# Patient Record
Sex: Male | Born: 1970
Health system: Southern US, Community
[De-identification: ages and names within clinical notes are randomized; demographics above are authoritative.]

## PROBLEM LIST (undated history)

## (undated) DIAGNOSIS — K219 Gastro-esophageal reflux disease without esophagitis: Secondary | ICD-10-CM

## (undated) DIAGNOSIS — K649 Unspecified hemorrhoids: Secondary | ICD-10-CM

## (undated) DIAGNOSIS — G44009 Cluster headache syndrome, unspecified, not intractable: Secondary | ICD-10-CM

## (undated) HISTORY — DX: Unspecified hemorrhoids: K64.9

## (undated) HISTORY — DX: Cluster headache syndrome, unspecified, not intractable: G44.009

## (undated) HISTORY — PX: HEMORRHOID SURGERY: SHX153

## (undated) HISTORY — DX: Gastro-esophageal reflux disease without esophagitis: K21.9

## (undated) HISTORY — PX: CYSTOSCOPY: SUR368

## (undated) HISTORY — PX: HEMORRHOID BANDING: SHX5850

---

## 2006-07-28 ENCOUNTER — Ambulatory Visit (HOSPITAL_COMMUNITY): Admission: RE | Admit: 2006-07-28 | Discharge: 2006-07-28 | Payer: Self-pay | Admitting: Family Medicine

## 2006-07-29 ENCOUNTER — Ambulatory Visit (HOSPITAL_COMMUNITY): Admission: RE | Admit: 2006-07-29 | Discharge: 2006-07-29 | Payer: Self-pay | Admitting: Family Medicine

## 2006-08-07 ENCOUNTER — Encounter (HOSPITAL_COMMUNITY): Admission: RE | Admit: 2006-08-07 | Discharge: 2006-09-06 | Payer: Self-pay | Admitting: Family Medicine

## 2007-02-03 ENCOUNTER — Ambulatory Visit (HOSPITAL_COMMUNITY): Admission: RE | Admit: 2007-02-03 | Discharge: 2007-02-03 | Payer: Self-pay | Admitting: Family Medicine

## 2008-10-18 ENCOUNTER — Ambulatory Visit (HOSPITAL_COMMUNITY): Admission: RE | Admit: 2008-10-18 | Discharge: 2008-10-18 | Payer: Self-pay | Admitting: Family Medicine

## 2010-08-20 ENCOUNTER — Ambulatory Visit: Payer: Self-pay | Admitting: Internal Medicine

## 2010-08-20 ENCOUNTER — Ambulatory Visit: Payer: Self-pay | Admitting: Gastroenterology

## 2010-08-20 DIAGNOSIS — K921 Melena: Secondary | ICD-10-CM | POA: Insufficient documentation

## 2010-08-20 DIAGNOSIS — K644 Residual hemorrhoidal skin tags: Secondary | ICD-10-CM | POA: Insufficient documentation

## 2010-08-20 DIAGNOSIS — R109 Unspecified abdominal pain: Secondary | ICD-10-CM | POA: Insufficient documentation

## 2010-08-21 ENCOUNTER — Encounter: Payer: Self-pay | Admitting: Internal Medicine

## 2010-08-31 ENCOUNTER — Ambulatory Visit (HOSPITAL_COMMUNITY): Admission: RE | Admit: 2010-08-31 | Discharge: 2010-08-31 | Payer: Self-pay | Admitting: Internal Medicine

## 2010-08-31 ENCOUNTER — Ambulatory Visit: Payer: Self-pay | Admitting: Internal Medicine

## 2010-08-31 HISTORY — PX: COLONOSCOPY: SHX174

## 2010-10-23 ENCOUNTER — Encounter: Payer: Self-pay | Admitting: Internal Medicine

## 2010-11-27 NOTE — Letter (Signed)
Summary: REFERRAL FROM DR Phillips Odor  REFERRAL FROM DR Phillips Odor   Imported By: Rexene Alberts 08/21/2010 11:32:04  _____________________________________________________________________  External Attachment:    Type:   Image     Comment:   External Document

## 2010-11-27 NOTE — Letter (Signed)
Summary: TCS ORDER  TCS ORDER   Imported By: Ave Filter 08/20/2010 15:48:20  _____________________________________________________________________  External Attachment:    Type:   Image     Comment:   External Document

## 2010-11-27 NOTE — Assessment & Plan Note (Signed)
Summary: RECTAL BLEEDING,CONSULT FOR TCS/CM   Visit Type:  Initial Consult Referring Provider:  Dwyane Luo Primary Care Provider:  Phillips Odor  CC:  rectal bleeding.  History of Present Illness: Mr. Samuel Hardy is a pleasant 40 year old Caucasian male who presents today due to an acute onset of rectal bleeding that began on 07/15/10. He was actually at a family member's funeral and felt a "gush". He proceeded to the restroom where he noted a large amount of bright red blood per rectum. No clots. Noted 2 other separate occasions of moderate brbpr after BM, in stool and wiping. Has hx of known hemorrhoids as documented by PCP as well as hx of emergent repair while on vacation in [place]. Does have intermittent itching and burning associated with hemorrhoids. Reports occasional lower abdominal pain relieved by diarrhea, increased flatus X 1 year. Normally has 1-3 BMs daily. Also complains of LUQ discomfort, sharp, intermittent, not associated with eating or drinking for the past year as well. Occasionally takes Tums but not weekly. Does experience nausea with cluster headaches, otherwise no nausea noted. No appetite loss, No weight changes. Last colonoscopy 10 years ago at [facility] secondary to rectal bleeding. Reportedly normal.    Preventive Screening-Counseling & Management  Alcohol-Tobacco     Smoking Status: quit  Caffeine-Diet-Exercise     Does Patient Exercise: no      Drug Use:  no.    Current Medications (verified): 1)  Imitrex .... As Needed 2)  Prednisone Dose Pak .... As Directed 3)  Verapamil .... As Needed 4)  Oxygen .... As Needed 5)  Alprazolam 0.25 Mg Tabs (Alprazolam) .... 1/2 To One Tablet Occasionally  Allergies (verified): 1)  ! Jonne Ply  Past History:  Past Medical History: Cluster headaches hemorrhoids GERD TCS: 10 years ago in [place], reportedly normal  Past Surgical History: Hemorrhoidectomy [facility]) cystoscopy when 13  Family History: No FH of Colon  Cancer: Mother:living, Diabetes Father: deceased, Alzheimer's disease  Social History: Occupation: Technical brewer Patient is a former smoker. quit 6 years ago, 1-2 ppd X 14 days Alcohol Use - yes, socially Daily Caffeine Use: Coke, several cans per day Illicit Drug Use - no Patient does not get regular exercise.  Smoking Status:  quit Drug Use:  no Does Patient Exercise:  no  Review of Systems General:  Denies fever, chills, and anorexia. Eyes:  Denies blurring, irritation, and discharge. ENT:  Denies sore throat, hoarseness, and difficulty swallowing. CV:  Denies chest pains, syncope, and dyspnea on exertion. Resp:  Denies dyspnea at rest, cough, and wheezing. GI:  Complains of indigestion/heartburn, abdominal pain, and diarrhea; denies difficulty swallowing, pain on swallowing, nausea, and bloody BM's; occasional indigestion. GU:  Denies urinary burning, blood in urine, and urinary frequency. MS:  Denies joint pain / LOM, joint swelling, and joint stiffness. Derm:  Denies rash, itching, and dry skin. Neuro:  Denies weakness, paralysis, and abnormal sensation. Psych:  Denies depression and anxiety.   Vital Signs:  Patient profile:   40 year old male Height:      73 inches Weight:      175 pounds BMI:     23.17 Temp:     97.8 degrees F oral Pulse rate:   80 / minute BP sitting:   120 / 78  (right arm) Cuff size:   regular  Vitals Entered By: Cloria Spring LPN (August 20, 2010 2:27 PM)  Physical Exam  General:  Well developed, well nourished, no acute distress. Head:  Normocephalic and  atraumatic. Eyes:  non-icteric Mouth:  No deformity or lesions, dentition normal. Neck:  Supple; no masses or thyromegaly. Chest Wall:  Symmetrical,  no deformities . Lungs:  Clear throughout to auscultation. Heart:  Regular rate and rhythm; no murmurs, rubs,  or bruits. Abdomen:  normal bowel sounds, without guarding, without rebound, no tenderness, no masses, and no  hepatomegally or splenomegaly.   Rectal:  deferred until time of colonoscopy.   Msk:  Symmetrical with no gross deformities. Normal posture. Pulses:  Normal pulses noted. Extremities:  No clubbing, cyanosis, edema or deformities noted. Neurologic:  Alert and  oriented x4;  grossly normal neurologically.  Impression & Recommendations:  Problem # 1:  HEMATOCHEZIA (ICD-578.1) Assessment New  40 year old pleasant Caucasian male with new onset of rectal bleeding in September 2011. First occurence unrelated to BM and large amount; subsequent 2 episodes associated with BM. Known hx of hemorrhoids as well as repair in past; however, associated lower abdominal pain and last colonoscopy 10 years ago.    Will need TCS with Dr. Jena Gauss in the near future. The risks and benefits have been discussed with the patient and his wife; they both state understanding and desire to proceed.   Orders: Consultation Level III (16109)  Problem # 2:  ABDOMINAL PAIN-MULTIPLE SITES (ICD-789.09) Assessment: New  Lower abdominal pain with resolution after diarrhea. Diarrhea is rare and intermittent. Normally has 1-3 soft BMs daily. LUQ pain, sharp and intermittent in nature, not associated with eating/drinking X 1 year. Rarely takes Tums.   Prilosec 20 mg daily, 2 weeks worth of samples given to pt. Rx called to pt's pharmacy See #1   Orders: Consultation Level III (60454)  Problem # 3:  HEMORRHOIDS-EXTERNAL (ICD-455.3)  Known hx of hemorrhoids requiring repair while on vacation in [facility]; recent rectal exam by PCP. Not taking anything currently for relief.   Anusol 25 mg supp per rectum twice daily as needed, #20 sent to pt's pharmacy TCS in near future with Dr. Jena Gauss  Orders: Consultation Level III [id number])  Patient Instructions: 1)  TCS will be set up with Dr. Jena Gauss in near future 2)  Prilosec 20 mg by mouth daily, call if no improvement 3)  Anusol suppositories Prescriptions: OMEPRAZOLE 20 MG  CPDR (OMEPRAZOLE) 1 by mouth daily  #30 x 3   Entered and Authorized by:   Gerrit Halls NP   Signed by:   Gerrit Halls NP on 08/20/2010   Method used:   Faxed to ...       [facility]* (retail)       [address]       [place], [place]  [address]       Ph: [phone]       Fax: [phone]   RxID:   [id number] ANUSOL-HC 25 MG SUPP (HYDROCORTISONE ACETATE) 1 per rectum two times a day  #20 x 0   Entered by:   Gerrit Halls NP   Authorized by:   Joselyn Arrow FNP-BC   Signed by:   Gerrit Halls NP on 08/20/2010   Method used:   Print then Give to Patient   RxID:   [id number]  We would like to thank Dwyane Luo, PA-C and Dr. Assunta Found for the referral of this nice gentleman.

## 2014-07-19 ENCOUNTER — Encounter (INDEPENDENT_AMBULATORY_CARE_PROVIDER_SITE_OTHER): Payer: Self-pay

## 2014-07-19 ENCOUNTER — Encounter: Payer: Self-pay | Admitting: Internal Medicine

## 2014-07-19 ENCOUNTER — Ambulatory Visit (INDEPENDENT_AMBULATORY_CARE_PROVIDER_SITE_OTHER): Payer: 59 | Admitting: Internal Medicine

## 2014-07-19 VITALS — BP 128/83 | HR 79 | Temp 97.4°F | Ht 69.0 in | Wt 175.0 lb

## 2014-07-19 DIAGNOSIS — K648 Other hemorrhoids: Secondary | ICD-10-CM

## 2014-07-19 NOTE — Patient Instructions (Signed)
Avoid straining.  Benefiber 2 teaspoons twice daily  Limit toilet time to 2-3 minutes  Use nitroglycerin ointment as prescribed  Call with any interim problems  Schedule followup appointment in 2-3 weeks from now

## 2014-07-19 NOTE — Progress Notes (Signed)
[facility] banding procedure note:   Patient presents with a 20 year history of painful hemorrhoids intermittent protrusion bleeding these days. Has itching and burning after most bowel movements denies straining. Hemorrhoid creams and suppositories have not helped. He desires banding.  Intermittently has to reduce the hemorrhoid. The patient presents with symptomatic grade 2-3 hemorrhoids, unresponsive to maximal medical therapy, requesting rubber band ligation of his/her hemorrhoidal disease. All risks, benefits, and alternative forms of therapy were described and informed consent was obtained.  In the left lateral decubitus position, digital rectal exam performed revealed one small protruding hemorrhoid which was easily reducible. No other abnormalities observed..  The decision was made to band the right anterior internal hemorrhoid; the [facility] System was used to perform band ligation without complication. Digital anorectal examination was then performed to assure proper positioning of the band;  patient had discomfort upon digital rectal exam before and after band placement suggestive of a small occult fissure. The band, itself, was felt to be in optimal position upon digital examination. I elected to go ahead and place a pea-sized amount of nitroglycerin 0.125% in the anus following the procedure.The patient was discharged home without pain or other issues. Dietary and behavioral recommendations were given;  prescription for nitroglycerin ointment as well.  Followup appointment in 2-3 weeks.  No complications were encountered and the patient tolerated the procedure well.

## 2014-08-16 ENCOUNTER — Encounter: Payer: 59 | Admitting: Internal Medicine

## 2014-08-16 ENCOUNTER — Encounter: Payer: Self-pay | Admitting: Internal Medicine

## 2016-06-26 ENCOUNTER — Ambulatory Visit (HOSPITAL_COMMUNITY)
Admission: RE | Admit: 2016-06-26 | Discharge: 2016-06-26 | Disposition: A | Discharge status: Home / Self Care | Payer: Managed Care, Other (non HMO) | Source: Ambulatory Visit | Attending: Family Medicine | Admitting: Family Medicine

## 2016-06-26 ENCOUNTER — Other Ambulatory Visit (HOSPITAL_COMMUNITY): Payer: Self-pay | Admitting: Family Medicine

## 2016-06-26 DIAGNOSIS — N50812 Left testicular pain: Secondary | ICD-10-CM | POA: Insufficient documentation

## 2016-06-26 DIAGNOSIS — I861 Scrotal varices: Secondary | ICD-10-CM | POA: Diagnosis not present

## 2017-05-30 DIAGNOSIS — M5410 Radiculopathy, site unspecified: Secondary | ICD-10-CM | POA: Diagnosis not present

## 2017-05-30 DIAGNOSIS — Z1389 Encounter for screening for other disorder: Secondary | ICD-10-CM | POA: Diagnosis not present

## 2017-05-30 DIAGNOSIS — Z6825 Body mass index (BMI) 25.0-25.9, adult: Secondary | ICD-10-CM | POA: Diagnosis not present

## 2017-05-30 DIAGNOSIS — M545 Low back pain: Secondary | ICD-10-CM | POA: Diagnosis not present

## 2017-05-30 DIAGNOSIS — M5136 Other intervertebral disc degeneration, lumbar region: Secondary | ICD-10-CM | POA: Diagnosis not present

## 2017-05-30 DIAGNOSIS — E663 Overweight: Secondary | ICD-10-CM | POA: Diagnosis not present

## 2017-05-30 MED FILL — CELECOXIB 200 MG CAP: 200 | 90 days supply | Qty: 90 | Fill #0

## 2017-05-30 MED FILL — HYDROCODON-APAP 5-325: 5-325 | 10 days supply | Qty: 60 | Fill #0

## 2017-05-30 MED FILL — CYCLOBENZAPRINE 10 MG TAB: 10 | 20 days supply | Qty: 60 | Fill #0

## 2017-06-06 ENCOUNTER — Other Ambulatory Visit (HOSPITAL_COMMUNITY): Payer: Self-pay | Admitting: Family Medicine

## 2017-06-06 DIAGNOSIS — M5136 Other intervertebral disc degeneration, lumbar region: Secondary | ICD-10-CM

## 2017-06-06 DIAGNOSIS — M5416 Radiculopathy, lumbar region: Secondary | ICD-10-CM

## 2017-06-19 DIAGNOSIS — F419 Anxiety disorder, unspecified: Secondary | ICD-10-CM | POA: Diagnosis not present

## 2017-06-19 DIAGNOSIS — Z008 Encounter for other general examination: Secondary | ICD-10-CM | POA: Diagnosis not present

## 2017-06-19 DIAGNOSIS — Z6824 Body mass index (BMI) 24.0-24.9, adult: Secondary | ICD-10-CM | POA: Diagnosis not present

## 2017-06-19 DIAGNOSIS — G43109 Migraine with aura, not intractable, without status migrainosus: Secondary | ICD-10-CM | POA: Diagnosis not present

## 2017-06-19 DIAGNOSIS — Z0000 Encounter for general adult medical examination without abnormal findings: Secondary | ICD-10-CM | POA: Diagnosis not present

## 2017-07-01 ENCOUNTER — Ambulatory Visit (HOSPITAL_COMMUNITY): Payer: 59

## 2017-07-02 ENCOUNTER — Telehealth: Payer: Self-pay

## 2017-07-02 NOTE — Telephone Encounter (Signed)
Pt's wife, Samuel Hardy, called to say that he had received a letter from DS to schedule his colonoscopy. She has questions about if this will be considered a screening since this will not be his first colonoscopy. I told her that DS would have to call him back and she does triages in the afternoon and she can answer all their questions then or she could call the insurance company. Please call [phone]

## 2017-07-03 ENCOUNTER — Ambulatory Visit (HOSPITAL_COMMUNITY)
Admission: RE | Admit: 2017-07-03 | Discharge: 2017-07-03 | Disposition: A | Discharge status: Home / Self Care | Payer: 59 | Source: Ambulatory Visit | Attending: Family Medicine | Admitting: Family Medicine

## 2017-07-03 DIAGNOSIS — M545 Low back pain: Secondary | ICD-10-CM | POA: Diagnosis not present

## 2017-07-03 DIAGNOSIS — M4807 Spinal stenosis, lumbosacral region: Secondary | ICD-10-CM | POA: Diagnosis not present

## 2017-07-03 DIAGNOSIS — M5136 Other intervertebral disc degeneration, lumbar region: Secondary | ICD-10-CM | POA: Insufficient documentation

## 2017-07-03 DIAGNOSIS — M5416 Radiculopathy, lumbar region: Secondary | ICD-10-CM

## 2017-07-10 NOTE — Telephone Encounter (Signed)
Tried to call with no answer  

## 2017-07-15 ENCOUNTER — Telehealth: Payer: Self-pay

## 2017-07-15 NOTE — Telephone Encounter (Signed)
Couple of things, he will need ov due to pain med and etoh use.  Also, I would recommend he check with his insurance to see if a "screening colonoscopy" is paid for given he is only 46. This is new age for screen based on American Cancer Guidelines but insurance companies may not be on board yet.

## 2017-07-15 NOTE — Telephone Encounter (Signed)
Gastroenterology Pre-Procedure Review  Request Date: 07/15/2017 Requesting Physician: Dr. Phillips Odor  PATIENT REVIEW QUESTIONS: The patient responded to the following health history questions as indicated:    Information given by pt's wife Pattie/ last colonoscopy by Dr. Jena Gauss 08/31/2010 He recently had some back issues and an episode of abdominal pain which has subsided Diagnosed with moderate disc disease He had an episode of blood in stool several months ago but none recently Would like to have screening colonoscopy  1. Diabetes Melitis: no 2. Joint replacements in the past 12 months: no 3. Major health problems in the past 3 months: no 4. Has an artificial valve or MVP: no 5. Has a defibrillator: no 6. Has been advised in past to take antibiotics in advance of a procedure like teeth cleaning: no 7. Family history of colon cancer: no  8. Alcohol Use: Drinks a 6 pack several times a week 9. History of sleep apnea: no  10. History of coronary artery or other vascular stents placed within the last 12 months: no 11. History of any prior anesthesia complications: no    MEDICATIONS & ALLERGIES:    Patient reports the following regarding taking any blood thinners:   Plavix? NO Aspirin? no Coumadin? no Brilinta? no Xarelto? no Eliquis? no Pradaxa? no Savaysa? no Effient? no  Patient confirms/reports the following medications:  Current Outpatient Prescriptions  Medication Sig Dispense Refill  . cyclobenzaprine (FLEXERIL) 10 MG tablet Take 10 mg by mouth 3 (three) times daily as needed for muscle spasms.    Marland Kitchen HYDROcodone-acetaminophen (NORCO/VICODIN) 5-325 MG tablet Take 1 tablet by mouth every 6 (six) hours as needed for moderate pain.    . NON FORMULARY Compounded pain cream from [facility] for his back  To use as needed     Once a day usually    . SUMAtriptan (IMITREX) 20 MG/ACT nasal spray Place 20 mg into the nose every 2 (two) hours as needed for migraine or headache.  May repeat in 2 hours if headache persists or recurs.    . predniSONE (STERAPRED UNI-PAK) 5 MG TABS tablet Take 5 mg by mouth as needed.    . verapamil (CALAN) 80 MG tablet Take 80 mg by mouth as needed. Takes as needed for cluster headaches     No current facility-administered medications for this visit.     Patient confirms/reports the following allergies:  Allergies  Allergen Reactions  . Aspirin     No orders of the defined types were placed in this encounter.   AUTHORIZATION INFORMATION Primary Insurance:   ID #:   Group #:  Pre-Cert / Auth required:  Pre-Cert / Auth #:   Secondary Insurance:  ID #:   Group #:  Pre-Cert / Auth required:  Pre-Cert / Auth #:  SCHEDULE INFORMATION: Procedure has been scheduled as follows:  Date:  Time:   Location:   This Gastroenterology Pre-Precedure Review Form is being routed to the following provider(s): R. Roetta Sessions, MD

## 2017-07-16 NOTE — Telephone Encounter (Signed)
See separate triage and note.

## 2017-07-17 NOTE — Telephone Encounter (Signed)
PT is aware and will call his insurance co and then call to make appt for OV if he plans to do it.

## 2017-07-17 NOTE — Telephone Encounter (Signed)
LMOM to call.

## 2017-08-21 MED FILL — CELECOXIB 200 MG CAPS: 200 | 90 days supply | Qty: 90 | Fill #0

## 2017-09-15 DIAGNOSIS — M5417 Radiculopathy, lumbosacral region: Secondary | ICD-10-CM | POA: Diagnosis not present

## 2017-09-15 DIAGNOSIS — M5126 Other intervertebral disc displacement, lumbar region: Secondary | ICD-10-CM | POA: Diagnosis not present

## 2017-09-15 DIAGNOSIS — R030 Elevated blood-pressure reading, without diagnosis of hypertension: Secondary | ICD-10-CM | POA: Diagnosis not present

## 2017-09-15 DIAGNOSIS — M9983 Other biomechanical lesions of lumbar region: Secondary | ICD-10-CM | POA: Diagnosis not present

## 2017-09-15 DIAGNOSIS — M545 Low back pain: Secondary | ICD-10-CM | POA: Diagnosis not present

## 2017-09-15 DIAGNOSIS — M5416 Radiculopathy, lumbar region: Secondary | ICD-10-CM | POA: Diagnosis not present

## 2017-09-15 DIAGNOSIS — M5137 Other intervertebral disc degeneration, lumbosacral region: Secondary | ICD-10-CM | POA: Diagnosis not present

## 2017-11-11 DIAGNOSIS — S233XXA Sprain of ligaments of thoracic spine, initial encounter: Secondary | ICD-10-CM | POA: Diagnosis not present

## 2017-11-11 DIAGNOSIS — S134XXA Sprain of ligaments of cervical spine, initial encounter: Secondary | ICD-10-CM | POA: Diagnosis not present

## 2017-11-11 DIAGNOSIS — S335XXA Sprain of ligaments of lumbar spine, initial encounter: Secondary | ICD-10-CM | POA: Diagnosis not present

## 2017-11-13 DIAGNOSIS — S233XXA Sprain of ligaments of thoracic spine, initial encounter: Secondary | ICD-10-CM | POA: Diagnosis not present

## 2017-11-13 DIAGNOSIS — S335XXA Sprain of ligaments of lumbar spine, initial encounter: Secondary | ICD-10-CM | POA: Diagnosis not present

## 2017-11-13 DIAGNOSIS — S134XXA Sprain of ligaments of cervical spine, initial encounter: Secondary | ICD-10-CM | POA: Diagnosis not present

## 2017-11-19 MED FILL — CELECOXIB 200 MG CAP: 200 | 90 days supply | Qty: 90 | Fill #1

## 2017-11-20 DIAGNOSIS — S335XXA Sprain of ligaments of lumbar spine, initial encounter: Secondary | ICD-10-CM | POA: Diagnosis not present

## 2017-11-20 DIAGNOSIS — S134XXA Sprain of ligaments of cervical spine, initial encounter: Secondary | ICD-10-CM | POA: Diagnosis not present

## 2017-11-20 DIAGNOSIS — S233XXA Sprain of ligaments of thoracic spine, initial encounter: Secondary | ICD-10-CM | POA: Diagnosis not present

## 2017-11-27 DIAGNOSIS — S233XXA Sprain of ligaments of thoracic spine, initial encounter: Secondary | ICD-10-CM | POA: Diagnosis not present

## 2017-11-27 DIAGNOSIS — S134XXA Sprain of ligaments of cervical spine, initial encounter: Secondary | ICD-10-CM | POA: Diagnosis not present

## 2017-11-27 DIAGNOSIS — S335XXA Sprain of ligaments of lumbar spine, initial encounter: Secondary | ICD-10-CM | POA: Diagnosis not present

## 2017-12-04 DIAGNOSIS — M5417 Radiculopathy, lumbosacral region: Secondary | ICD-10-CM | POA: Diagnosis not present

## 2017-12-17 NOTE — Progress Notes (Unsigned)
Triage.

## 2018-01-01 DIAGNOSIS — M5417 Radiculopathy, lumbosacral region: Secondary | ICD-10-CM | POA: Diagnosis not present

## 2018-02-04 DIAGNOSIS — M5417 Radiculopathy, lumbosacral region: Secondary | ICD-10-CM | POA: Diagnosis not present

## 2018-02-17 MED FILL — CELECOXIB 200 MG CAP: 200 | 90 days supply | Qty: 90 | Fill #2

## 2018-03-05 DIAGNOSIS — H5213 Myopia, bilateral: Secondary | ICD-10-CM | POA: Diagnosis not present

## 2018-03-05 DIAGNOSIS — H52223 Regular astigmatism, bilateral: Secondary | ICD-10-CM | POA: Diagnosis not present

## 2018-04-20 DIAGNOSIS — M5417 Radiculopathy, lumbosacral region: Secondary | ICD-10-CM | POA: Diagnosis not present

## 2018-05-20 MED FILL — CELECOXIB 200 MG CAP: 200 | 90 days supply | Qty: 90 | Fill #3

## 2018-05-25 DIAGNOSIS — M5417 Radiculopathy, lumbosacral region: Secondary | ICD-10-CM | POA: Diagnosis not present

## 2018-05-25 DIAGNOSIS — M9983 Other biomechanical lesions of lumbar region: Secondary | ICD-10-CM | POA: Diagnosis not present

## 2018-05-25 DIAGNOSIS — M25551 Pain in right hip: Secondary | ICD-10-CM | POA: Diagnosis not present

## 2018-05-25 DIAGNOSIS — R030 Elevated blood-pressure reading, without diagnosis of hypertension: Secondary | ICD-10-CM | POA: Diagnosis not present

## 2018-06-02 DIAGNOSIS — Z6825 Body mass index (BMI) 25.0-25.9, adult: Secondary | ICD-10-CM | POA: Diagnosis not present

## 2018-06-02 DIAGNOSIS — G4730 Sleep apnea, unspecified: Secondary | ICD-10-CM | POA: Diagnosis not present

## 2018-06-02 DIAGNOSIS — R5383 Other fatigue: Secondary | ICD-10-CM | POA: Diagnosis not present

## 2018-06-02 DIAGNOSIS — Z1389 Encounter for screening for other disorder: Secondary | ICD-10-CM | POA: Diagnosis not present

## 2018-06-02 DIAGNOSIS — E663 Overweight: Secondary | ICD-10-CM | POA: Diagnosis not present

## 2018-06-08 MED FILL — diazePAM 5 MG TABS: 5 | 2 days supply | Qty: 2 | Fill #0

## 2018-06-10 DIAGNOSIS — S73191A Other sprain of right hip, initial encounter: Secondary | ICD-10-CM | POA: Diagnosis not present

## 2018-06-10 DIAGNOSIS — M25551 Pain in right hip: Secondary | ICD-10-CM | POA: Diagnosis not present

## 2018-06-12 DIAGNOSIS — G4730 Sleep apnea, unspecified: Secondary | ICD-10-CM | POA: Diagnosis not present

## 2018-06-25 DIAGNOSIS — G4730 Sleep apnea, unspecified: Secondary | ICD-10-CM | POA: Diagnosis not present

## 2018-06-26 DIAGNOSIS — G4733 Obstructive sleep apnea (adult) (pediatric): Secondary | ICD-10-CM | POA: Diagnosis not present

## 2018-07-23 DIAGNOSIS — G4733 Obstructive sleep apnea (adult) (pediatric): Secondary | ICD-10-CM | POA: Diagnosis not present

## 2018-08-06 DIAGNOSIS — M25559 Pain in unspecified hip: Secondary | ICD-10-CM | POA: Diagnosis not present

## 2018-08-22 DIAGNOSIS — G4733 Obstructive sleep apnea (adult) (pediatric): Secondary | ICD-10-CM | POA: Diagnosis not present

## 2018-08-27 DIAGNOSIS — M5417 Radiculopathy, lumbosacral region: Secondary | ICD-10-CM | POA: Diagnosis not present

## 2018-09-18 DIAGNOSIS — E663 Overweight: Secondary | ICD-10-CM | POA: Diagnosis not present

## 2018-09-18 DIAGNOSIS — Z6825 Body mass index (BMI) 25.0-25.9, adult: Secondary | ICD-10-CM | POA: Diagnosis not present

## 2018-09-18 DIAGNOSIS — Z1389 Encounter for screening for other disorder: Secondary | ICD-10-CM | POA: Diagnosis not present

## 2018-09-18 DIAGNOSIS — Z01818 Encounter for other preprocedural examination: Secondary | ICD-10-CM | POA: Diagnosis not present

## 2018-09-18 DIAGNOSIS — G43109 Migraine with aura, not intractable, without status migrainosus: Secondary | ICD-10-CM | POA: Diagnosis not present

## 2018-09-22 DIAGNOSIS — G4733 Obstructive sleep apnea (adult) (pediatric): Secondary | ICD-10-CM | POA: Diagnosis not present

## 2018-09-28 NOTE — Progress Notes (Signed)
08-13-18 Surgical clearance from Dr. Phillips OdorGolding on chart  09-18-18 EKG on chart

## 2018-10-08 DIAGNOSIS — M9983 Other biomechanical lesions of lumbar region: Secondary | ICD-10-CM | POA: Diagnosis not present

## 2018-10-08 DIAGNOSIS — M25551 Pain in right hip: Secondary | ICD-10-CM | POA: Diagnosis not present

## 2018-10-09 ENCOUNTER — Encounter (HOSPITAL_COMMUNITY): Payer: Self-pay | Admitting: Student

## 2018-10-09 ENCOUNTER — Other Ambulatory Visit: Payer: Self-pay

## 2018-10-09 ENCOUNTER — Encounter (HOSPITAL_COMMUNITY)
Admission: RE | Admit: 2018-10-09 | Discharge: 2018-10-09 | Disposition: A | Discharge status: Home / Self Care | Payer: 59 | Source: Ambulatory Visit | Attending: Orthopedic Surgery | Admitting: Orthopedic Surgery

## 2018-10-09 ENCOUNTER — Encounter (HOSPITAL_COMMUNITY): Payer: Self-pay

## 2018-10-09 DIAGNOSIS — Z01812 Encounter for preprocedural laboratory examination: Secondary | ICD-10-CM

## 2018-10-09 DIAGNOSIS — Z87891 Personal history of nicotine dependence: Secondary | ICD-10-CM | POA: Diagnosis not present

## 2018-10-09 DIAGNOSIS — Z881 Allergy status to other antibiotic agents status: Secondary | ICD-10-CM | POA: Diagnosis not present

## 2018-10-09 DIAGNOSIS — M1611 Unilateral primary osteoarthritis, right hip: Secondary | ICD-10-CM | POA: Diagnosis not present

## 2018-10-09 DIAGNOSIS — M87851 Other osteonecrosis, right femur: Secondary | ICD-10-CM | POA: Diagnosis not present

## 2018-10-09 DIAGNOSIS — Z886 Allergy status to analgesic agent status: Secondary | ICD-10-CM | POA: Diagnosis not present

## 2018-10-09 DIAGNOSIS — K219 Gastro-esophageal reflux disease without esophagitis: Secondary | ICD-10-CM | POA: Diagnosis not present

## 2018-10-09 DIAGNOSIS — Z79899 Other long term (current) drug therapy: Secondary | ICD-10-CM | POA: Diagnosis not present

## 2018-10-09 LAB — COMPREHENSIVE METABOLIC PANEL
ALK PHOS: 52 U/L (ref 38–126)
ALT: 28 U/L (ref 0–44)
AST: 22 U/L (ref 15–41)
Albumin: 4.8 g/dL (ref 3.5–5.0)
Anion gap: 9 (ref 5–15)
BUN: 11 mg/dL (ref 6–20)
CALCIUM: 9.5 mg/dL (ref 8.9–10.3)
CHLORIDE: 105 mmol/L (ref 98–111)
CO2: 25 mmol/L (ref 22–32)
CREATININE: 1.15 mg/dL (ref 0.61–1.24)
GFR calc non Af Amer: >60 mL/min (ref >60)
Glucose, Bld: 97 mg/dL (ref 70–99)
Potassium: 3.7 mmol/L (ref 3.5–5.1)
SODIUM: 139 mmol/L (ref 135–145)
Total Bilirubin: 1.8 mg/dL — ABNORMAL HIGH (ref 0.3–1.2)
Total Protein: 7.2 g/dL (ref 6.5–8.1)

## 2018-10-09 LAB — CBC
HCT: 44.8 % (ref 39.0–52.0)
Hemoglobin: 15.3 g/dL (ref 13.0–17.0)
MCH: 32.1 pg (ref 26.0–34.0)
MCHC: 34.2 g/dL (ref 30.0–36.0)
MCV: 94.1 fL (ref 80.0–100.0)
NRBC: 0 % (ref 0.0–0.2)
PLATELETS: 186 10*3/uL (ref 150–400)
RBC: 4.76 MIL/uL (ref 4.22–5.81)
RDW: 11.9 % (ref 11.5–15.5)
WBC: 5.3 10*3/uL (ref 4.0–10.5)

## 2018-10-09 LAB — PROTIME-INR
INR: 1.01
Prothrombin Time: 13.2 seconds (ref 11.4–15.2)

## 2018-10-09 LAB — SURGICAL PCR SCREEN
MRSA, PCR: NEGATIVE
Staphylococcus aureus: POSITIVE — AB

## 2018-10-09 LAB — ABO/RH: ABO/RH(D): O POS

## 2018-10-09 LAB — APTT: APTT: 29 s (ref 24–36)

## 2018-10-09 NOTE — H&P (Signed)
TOTAL HIP ADMISSION H&P  Patient is admitted for right total hip arthroplasty.  Subjective:  Chief Complaint: right hip pain  HPI: Samuel Hardy, 47 y.o. 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  Onset of symptoms was abrupt starting a few months ago with rapidly worsening course since that time.The patient noted no past surgery on the right hip(s).  Patient currently rates pain in the right hip at 8 out of 10 with activity. Patient has night pain, worsening of pain with activity and weight bearing, pain that interfers with activities of daily living and pain with passive range of motion. Patient has evidence of a small area of collapse on the femoral head with subchondral cystic changes and subchondral sclerosis on the right by imaging studies. This condition presents safety issues increasing the risk of falls. There is no current active infection.  Patient Active Problem List   Diagnosis Date Noted  . HEMORRHOIDS-EXTERNAL 08/20/2010  . HEMATOCHEZIA 08/20/2010  . ABDOMINAL PAIN-MULTIPLE SITES 08/20/2010   Past Medical History:  Diagnosis Date  . Cluster headaches   . GERD (gastroesophageal reflux disease)   . Hemorrhoids     Past Surgical History:  Procedure Laterality Date  . COLONOSCOPY  08/31/2010   Dr.Rourk- anal canal hemorrhoids o/w normal rectum, colon, terminal ileum  . CYSTOSCOPY     age 47  . HEMORRHOID BANDING    . HEMORRHOID SURGERY      No current facility-administered medications for this encounter.    Current Outpatient Medications  Medication Sig Dispense Refill Last Dose  . NON FORMULARY Apply 1 application topically daily as needed (back pain). Compounded pain cream from [facility] for his back  To use as needed     Once a day usually    Taking  .  SUMAtriptan (IMITREX) 20 MG/ACT nasal spray Place 20 mg into the nose every 2 (two) hours as needed for migraine or headache. May repeat in 2 hours if headache persists or recurs.   Taking  . verapamil (CALAN) 80 MG tablet Take 80 mg by mouth daily as needed. Takes as needed for cluster headaches   Not Taking   Allergies  Allergen Reactions  . Aspirin Nausea Only    Social History   Tobacco Use  . Smoking status: Never Smoker  Substance Use Topics  . Alcohol use: Yes    Comment: 6 pack on weekend    History reviewed. No pertinent family history.   Review of Systems  Constitutional: Negative for chills and fever.  HENT: Negative for congestion, sore throat and tinnitus.   Eyes: Negative for double vision, photophobia and pain.  Respiratory: Negative for cough, shortness of breath and wheezing.   Cardiovascular: Negative for chest pain, palpitations and orthopnea.  Gastrointestinal: Negative for heartburn, nausea and vomiting.  Genitourinary: Negative for dysuria, frequency and urgency.  Musculoskeletal: Positive for joint pain.  Neurological: Negative for dizziness, weakness and headaches.    Objective:  Physical Exam  Well nourished and well developed.  General: Alert and oriented x3, cooperative and pleasant, no acute distress.  Head: normocephalic, atraumatic, neck supple.  Eyes: EOMI.  Respiratory: breath sounds clear in all fields, no wheezing, rales, or rhonchi. Cardiovascular: Regular rate and rhythm, no murmurs, gallops or rubs.  Abdomen: non-tender to palpation and soft, normoactive bowel sounds. Musculoskeletal: Antalgic  gait on the right and walking without assisted devices.  Right Hip Exam: ROM: Flexion to 100, Internal Rotation 20 with significant pain, External Rotation 30 with significant pain. There is no tenderness over the greater trochanter bursa.  Left Hip Exam: ROM: is normal without discomfort. There is no tenderness over the greater trochanter  bursa. Calves soft and nontender. Motor function intact in LE. Strength 5/5 LE bilaterally. Neuro: Distal pulses 2+. Sensation to light touch intact in LE.   Estimated body mass index is 25.84 kg/m as calculated from the following:   Height as of 07/19/14: 5\' 9"  (1.753 m).   Weight as of 07/19/14: 79.4 kg.   Imaging Review  MRI of the right hip shows a large area involved with avascular necrosis of the right femoral head with an area of collapse.   Radiographs- AP pelvis, AP and lateral of the bilateral hips dated 06/2018 demonstrates a small area of collapse on the femoral head with subchondral cystic changes and subchondral sclerosis on the right.  The bone quality appears to be adequate for age and reported activity level.    Preoperative templating of the joint replacement has been completed, documented, and submitted to the Operating Room personnel in order to optimize intra-operative equipment management.     Assessment/Plan:  End stage arthritis, right hip(s)  The patient history, physical examination, clinical judgement of the provider and imaging studies are consistent with end stage degenerative joint disease of the right hip(s) and total hip arthroplasty is deemed medically necessary. The treatment options including medical management, injection therapy, arthroscopy and arthroplasty were discussed at length. The risks and benefits of total hip arthroplasty were presented and reviewed. The risks due to aseptic loosening, infection, stiffness, dislocation/subluxation,  thromboembolic complications and other imponderables were discussed.  The patient acknowledged the explanation, agreed to proceed with the plan and consent was signed. Patient is being admitted for inpatient treatment for surgery, pain control, PT, OT, prophylactic antibiotics, VTE prophylaxis, progressive ambulation and ADL's and discharge planning.The patient is planning to be discharged home with HEP.    Therapy Plans: HEP Disposition: Home with wife  Planned DVT Prophylaxis: Aspirin 325 mg BID DME needed: Dan Humphreys PCP: Assunta Found TXA: IV Allergies: Clindamycin (vomiting) Anesthesia Concerns: None  - Patient was instructed on what medications to stop prior to surgery. - Follow-up visit in 2 weeks with Dr. Lequita Halt - Begin physical therapy following surgery - Pre-operative lab work as pre-surgical testing - Prescriptions will be provided in hospital at time of discharge  Arther Abbott, PA-C Orthopedic Surgery [facility] [place]

## 2018-10-09 NOTE — Patient Instructions (Signed)
Marijo FileJamie A Gaida  10/09/2018   Your procedure is scheduled on: 10-12-18     [facility]    Report to Admitting at 12:20 PM    Call this number if you have problems the morning of surgery [phone]    Remember: Do not eat food or drink liquids :After Midnight. You may have a Clear Liquid Diet from Midnight until 8:50 AM. After 8:50 AM, nothing until after surgery.     CLEAR LIQUID DIET   Foods Allowed                                                                     Foods Excluded  Coffee and tea, regular and decaf                             liquids that you cannot  Plain Jell-O in any flavor                                             see through such as: Fruit ices (not with fruit pulp)                                     milk, soups, orange juice  Iced Popsicles                                    All solid food Carbonated beverages, regular and diet                                    Cranberry, grape and apple juices Sports drinks like Gatorade Lightly seasoned clear broth or consume(fat free) Sugar, honey syrup  Sample Menu Breakfast                                Lunch                                     Supper Cranberry juice                    Beef broth                            Chicken broth Jell-O                                     Grape juice  Apple juice Coffee or tea                        Jell-O                                      Popsicle                                                Coffee or tea                        Coffee or tea  _____________________________________________________________________    BRUSH YOUR TEETH MORNING OF SURGERY AND RINSE YOUR MOUTH OUT, NO CHEWING GUM CANDY OR MINTS.     Take these medicines the morning of surgery with A SIP OF WATER: Imitrex, as needed                                 You may not have any metal on your body including hair  pins and              piercings  Do not wear jewelry, lotions, powders, cologne or deodorant             Men may shave face and neck.   Do not bring valuables to the hospital. [facility] IS NOT             RESPONSIBLE   FOR VALUABLES.  Contacts, dentures or bridgework may not be worn into surgery.  Leave suitcase in the car. After surgery it may be brought to your room.       Special Instructions: Please bring your mask and tubing for your CPAP machine              Please read over the following fact sheets you were given: _____________________________________________________________________             [facility] - Preparing for Surgery Before surgery, you can play an important role.  Because skin is not sterile, your skin needs to be as free of germs as possible.  You can reduce the number of germs on your skin by washing with CHG (chlorahexidine gluconate) soap before surgery.  CHG is an antiseptic cleaner which kills germs and bonds with the skin to continue killing germs even after washing. Please DO NOT use if you have an allergy to CHG or antibacterial soaps.  If your skin becomes reddened/irritated stop using the CHG and inform your nurse when you arrive at Short Stay. Do not shave (including legs and underarms) for at least 48 hours prior to the first CHG shower.  You may shave your face/neck. Please follow these instructions carefully:  1.  Shower with CHG Soap the night before surgery and the  morning of Surgery.  2.  If you choose to wash your hair, wash your hair first as usual with your  normal  shampoo.  3.  After you shampoo, rinse your hair and body thoroughly to remove the  shampoo.                           4.  Use CHG as you would any other liquid soap.  You can apply chg directly  to the skin and wash                       Gently with a scrungie or clean washcloth.  5.  Apply the CHG Soap to your body ONLY FROM THE NECK DOWN.   Do not use on face/ open                            Wound or open sores. Avoid contact with eyes, ears mouth and genitals (private parts).                       Wash face,  Genitals (private parts) with your normal soap.             6.  Wash thoroughly, paying special attention to the area where your surgery  will be performed.  7.  Thoroughly rinse your body with warm water from the neck down.  8.  DO NOT shower/wash with your normal soap after using and rinsing off  the CHG Soap.                9.  Pat yourself dry with a clean towel.            10.  Wear clean pajamas.            11.  Place clean sheets on your bed the night of your first shower and do not  sleep with pets. Day of Surgery : Do not apply any lotions/deodorants the morning of surgery.  Please wear clean clothes to the hospital/surgery center.  FAILURE TO FOLLOW THESE INSTRUCTIONS MAY RESULT IN THE CANCELLATION OF YOUR SURGERY PATIENT SIGNATURE_________________________________  NURSE SIGNATURE__________________________________  ________________________________________________________________________   Adam Phenix  An incentive spirometer is a tool that can help keep your lungs clear and active. This tool measures how well you are filling your lungs with each breath. Taking long deep breaths may help reverse or decrease the chance of developing breathing (pulmonary) problems (especially infection) following:  A long period of time when you are unable to move or be active. BEFORE THE PROCEDURE   If the spirometer includes an indicator to show your best effort, your nurse or respiratory therapist will set it to a desired goal.  If possible, sit up straight or lean slightly forward. Try not to slouch.  Hold the incentive spirometer in an upright position. INSTRUCTIONS FOR USE  1. Sit on the edge of your bed if possible, or sit up as far as you can in bed or on a chair. 2. Hold the incentive spirometer in an upright position. 3. Breathe out  normally. 4. Place the mouthpiece in your mouth and seal your lips tightly around it. 5. Breathe in slowly and as deeply as possible, raising the piston or the ball toward the top of the column. 6. Hold your breath for 3-5 seconds or for as long as possible. Allow the piston or ball to fall to the bottom of the column. 7. Remove the mouthpiece from your mouth and breathe out normally. 8. Rest for a few seconds and repeat Steps 1 through 7 at least 10 times every 1-2 hours when you are awake. Take your time and take a few normal breaths between deep breaths. 9. The spirometer may include an indicator to show your  best effort. Use the indicator as a goal to work toward during each repetition. 10. After each set of 10 deep breaths, practice coughing to be sure your lungs are clear. If you have an incision (the cut made at the time of surgery), support your incision when coughing by placing a pillow or rolled up towels firmly against it. Once you are able to get out of bed, walk around indoors and cough well. You may stop using the incentive spirometer when instructed by your caregiver.  RISKS AND COMPLICATIONS  Take your time so you do not get dizzy or light-headed.  If you are in pain, you may need to take or ask for pain medication before doing incentive spirometry. It is harder to take a deep breath if you are having pain. AFTER USE  Rest and breathe slowly and easily.  It can be helpful to keep track of a log of your progress. Your caregiver can provide you with a simple table to help with this. If you are using the spirometer at home, follow these instructions: [place] IF:   You are having difficultly using the spirometer.  You have trouble using the spirometer as often as instructed.  Your pain medication is not giving enough relief while using the spirometer.  You develop fever of 100.5 F (38.1 C) or higher. SEEK IMMEDIATE MEDICAL CARE IF:   You cough up bloody sputum  that had not been present before.  You develop fever of 102 F (38.9 C) or greater.  You develop worsening pain at or near the incision site. MAKE SURE YOU:   Understand these instructions.  Will watch your condition.  Will get help right away if you are not doing well or get worse. Document Released: 02/24/2007 Document Revised: 01/06/2012 Document Reviewed: 04/27/2007 [employer] Patient Information 2014 [employer], [place].   ________________________________________________________________________  WHAT IS A BLOOD TRANSFUSION? Blood Transfusion Information  A transfusion is the replacement of blood or some of its parts. Blood is made up of multiple cells which provide different functions.  Red blood cells carry oxygen and are used for blood loss replacement.  White blood cells fight against infection.  Platelets control bleeding.  Plasma helps clot blood.  Other blood products are available for specialized needs, such as hemophilia or other clotting disorders. BEFORE THE TRANSFUSION  Who gives blood for transfusions?   Healthy volunteers who are fully evaluated to make sure their blood is safe. This is blood bank blood. Transfusion therapy is the safest it has ever been in the practice of medicine. Before blood is taken from a donor, a complete history is taken to make sure that person has no history of diseases nor engages in risky social behavior (examples are intravenous drug use or sexual activity with multiple partners). The donor's travel history is screened to minimize risk of transmitting infections, such as malaria. The donated blood is tested for signs of infectious diseases, such as HIV and hepatitis. The blood is then tested to be sure it is compatible with you in order to minimize the chance of a transfusion reaction. If you or a relative donates blood, this is often done in anticipation of surgery and is not appropriate for emergency situations. It takes many days to  process the donated blood. RISKS AND COMPLICATIONS Although transfusion therapy is very safe and saves many lives, the main dangers of transfusion include:   Getting an infectious disease.  Developing a transfusion reaction. This is an allergic reaction to something  in the blood you were given. Every precaution is taken to prevent this. The decision to have a blood transfusion has been considered carefully by your caregiver before blood is given. Blood is not given unless the benefits outweigh the risks. AFTER THE TRANSFUSION  Right after receiving a blood transfusion, you will usually feel much better and more energetic. This is especially true if your red blood cells have gotten low (anemic). The transfusion raises the level of the red blood cells which carry oxygen, and this usually causes an energy increase.  The nurse administering the transfusion will monitor you carefully for complications. HOME CARE INSTRUCTIONS  No special instructions are needed after a transfusion. You may find your energy is better. Speak with your caregiver about any limitations on activity for underlying diseases you may have. SEEK MEDICAL CARE IF:   Your condition is not improving after your transfusion.  You develop redness or irritation at the intravenous (IV) site. SEEK IMMEDIATE MEDICAL CARE IF:  Any of the following symptoms occur over the next 12 hours:  Shaking chills.  You have a temperature by mouth above 102 F (38.9 C), not controlled by medicine.  Chest, back, or muscle pain.  People around you feel you are not acting correctly or are confused.  Shortness of breath or difficulty breathing.  Dizziness and fainting.  You get a rash or develop hives.  You have a decrease in urine output.  Your urine turns a dark color or changes to pink, red, or brown. Any of the following symptoms occur over the next 10 days:  You have a temperature by mouth above 102 F (38.9 C), not controlled by  medicine.  Shortness of breath.  Weakness after normal activity.  The white part of the eye turns yellow (jaundice).  You have a decrease in the amount of urine or are urinating less often.  Your urine turns a dark color or changes to pink, red, or brown. Document Released: 10/11/2000 Document Revised: 01/06/2012 Document Reviewed: 05/30/2008 [facility] Patient Information 2014 [place], [place].  _______________________________________________________________________

## 2018-10-12 ENCOUNTER — Inpatient Hospital Stay (HOSPITAL_COMMUNITY): Payer: 59 | Admitting: Anesthesiology

## 2018-10-12 ENCOUNTER — Inpatient Hospital Stay (HOSPITAL_COMMUNITY): Payer: 59

## 2018-10-12 ENCOUNTER — Encounter (HOSPITAL_COMMUNITY)
Admission: RE | Disposition: A | Discharge status: Home / Self Care | Payer: Self-pay | Source: Home / Self Care | Attending: Orthopedic Surgery

## 2018-10-12 ENCOUNTER — Encounter (HOSPITAL_COMMUNITY): Payer: Self-pay | Admitting: *Deleted

## 2018-10-12 ENCOUNTER — Other Ambulatory Visit: Payer: Self-pay

## 2018-10-12 ENCOUNTER — Inpatient Hospital Stay (HOSPITAL_COMMUNITY)
Admission: RE | Admit: 2018-10-12 | Discharge: 2018-10-13 | DRG: 470 | Disposition: A | Discharge status: Home / Self Care | Payer: 59 | Attending: Orthopedic Surgery | Admitting: Orthopedic Surgery

## 2018-10-12 DIAGNOSIS — Z96649 Presence of unspecified artificial hip joint: Secondary | ICD-10-CM

## 2018-10-12 DIAGNOSIS — Z79899 Other long term (current) drug therapy: Secondary | ICD-10-CM | POA: Diagnosis not present

## 2018-10-12 DIAGNOSIS — M1611 Unilateral primary osteoarthritis, right hip: Secondary | ICD-10-CM | POA: Diagnosis present

## 2018-10-12 DIAGNOSIS — Z87891 Personal history of nicotine dependence: Secondary | ICD-10-CM | POA: Diagnosis not present

## 2018-10-12 DIAGNOSIS — Z419 Encounter for procedure for purposes other than remedying health state, unspecified: Secondary | ICD-10-CM

## 2018-10-12 DIAGNOSIS — M87851 Other osteonecrosis, right femur: Principal | ICD-10-CM | POA: Diagnosis present

## 2018-10-12 DIAGNOSIS — K921 Melena: Secondary | ICD-10-CM | POA: Diagnosis not present

## 2018-10-12 DIAGNOSIS — Z886 Allergy status to analgesic agent status: Secondary | ICD-10-CM

## 2018-10-12 DIAGNOSIS — M87051 Idiopathic aseptic necrosis of right femur: Secondary | ICD-10-CM | POA: Diagnosis not present

## 2018-10-12 DIAGNOSIS — M169 Osteoarthritis of hip, unspecified: Secondary | ICD-10-CM

## 2018-10-12 DIAGNOSIS — Z471 Aftercare following joint replacement surgery: Secondary | ICD-10-CM | POA: Diagnosis not present

## 2018-10-12 DIAGNOSIS — Z881 Allergy status to other antibiotic agents status: Secondary | ICD-10-CM

## 2018-10-12 DIAGNOSIS — K219 Gastro-esophageal reflux disease without esophagitis: Secondary | ICD-10-CM | POA: Diagnosis present

## 2018-10-12 DIAGNOSIS — Z96641 Presence of right artificial hip joint: Secondary | ICD-10-CM | POA: Diagnosis not present

## 2018-10-12 DIAGNOSIS — K644 Residual hemorrhoidal skin tags: Secondary | ICD-10-CM | POA: Diagnosis not present

## 2018-10-12 HISTORY — PX: TOTAL HIP ARTHROPLASTY: SHX124

## 2018-10-12 LAB — TYPE AND SCREEN
ABO/RH(D): O POS
Antibody Screen: NEGATIVE

## 2018-10-12 SURGERY — ARTHROPLASTY, HIP, TOTAL, ANTERIOR APPROACH
Anesthesia: Spinal | Site: Hip | Laterality: Right

## 2018-10-12 MED ORDER — SODIUM CHLORIDE 0.9 % IV SOLN
INTRAVENOUS | Status: DC
  Administered 2018-10-13: via INTRAVENOUS

## 2018-10-12 MED ORDER — THROMBIN (RECOMBINANT) 5000 UNITS EX SOLR
CUTANEOUS | Status: AC
  Filled 2018-10-12: qty 5000

## 2018-10-12 MED ORDER — DOCUSATE SODIUM 100 MG PO CAPS
100.0000 mg | ORAL_CAPSULE | Freq: Two times a day (BID) | ORAL | Status: DC
  Administered 2018-10-12 – 2018-10-13 (×2): 100 mg via ORAL
  Filled 2018-10-12 (×2): qty 1

## 2018-10-12 MED ORDER — ONDANSETRON HCL 4 MG PO TABS: 4.0000 mg | ORAL_TABLET | Freq: Four times a day (QID) | ORAL | Status: DC | PRN

## 2018-10-12 MED ORDER — 0.9 % SODIUM CHLORIDE (POUR BTL) OPTIME
TOPICAL | Status: DC | PRN
  Administered 2018-10-12: 1000 mL

## 2018-10-12 MED ORDER — TRANEXAMIC ACID-NACL 1000-0.7 MG/100ML-% IV SOLN
1000.0000 mg | INTRAVENOUS | Status: AC
  Administered 2018-10-12: 1000 mg via INTRAVENOUS
  Filled 2018-10-12: qty 100

## 2018-10-12 MED ORDER — HYDROCODONE-ACETAMINOPHEN 5-325 MG PO TABS
1.0000 | ORAL_TABLET | ORAL | Status: DC | PRN
  Administered 2018-10-13 (×2): 2 via ORAL
  Filled 2018-10-12 (×2): qty 2

## 2018-10-12 MED ORDER — ACETAMINOPHEN 500 MG PO TABS
500.0000 mg | ORAL_TABLET | Freq: Four times a day (QID) | ORAL | Status: DC
  Administered 2018-10-12: 500 mg via ORAL
  Filled 2018-10-12: qty 1

## 2018-10-12 MED ORDER — CHLORHEXIDINE GLUCONATE 4 % EX LIQD: 60.0000 mL | Freq: Once | CUTANEOUS | Status: DC

## 2018-10-12 MED ORDER — POLYETHYLENE GLYCOL 3350 17 G PO PACK: 17.0000 g | PACK | Freq: Every day | ORAL | Status: DC | PRN

## 2018-10-12 MED ORDER — PROPOFOL 500 MG/50ML IV EMUL
INTRAVENOUS | Status: DC | PRN
  Administered 2018-10-12: 75 ug/kg/min via INTRAVENOUS

## 2018-10-12 MED ORDER — PROPOFOL 500 MG/50ML IV EMUL
INTRAVENOUS | Status: DC | PRN
  Administered 2018-10-12: 20 mg via INTRAVENOUS

## 2018-10-12 MED ORDER — OXYCODONE HCL 5 MG/5ML PO SOLN
5.0000 mg | Freq: Once | ORAL | Status: DC | PRN
  Filled 2018-10-12: qty 5

## 2018-10-12 MED ORDER — EPHEDRINE SULFATE 50 MG/ML IJ SOLN
INTRAMUSCULAR | Status: DC | PRN
  Administered 2018-10-12: 5 mg via INTRAVENOUS

## 2018-10-12 MED ORDER — CEFAZOLIN SODIUM-DEXTROSE 2-4 GM/100ML-% IV SOLN
2.0000 g | INTRAVENOUS | Status: AC
  Administered 2018-10-12: 2 g via INTRAVENOUS
  Filled 2018-10-12: qty 100

## 2018-10-12 MED ORDER — BUPIVACAINE IN DEXTROSE 0.75-8.25 % IT SOLN
INTRATHECAL | Status: DC | PRN
  Administered 2018-10-12: 1.6 mL via INTRATHECAL

## 2018-10-12 MED ORDER — FENTANYL CITRATE (PF) 100 MCG/2ML IJ SOLN
25.0000 ug | INTRAMUSCULAR | Status: DC | PRN
  Administered 2018-10-12 (×3): 50 ug via INTRAVENOUS

## 2018-10-12 MED ORDER — PROMETHAZINE HCL 25 MG/ML IJ SOLN: 6.2500 mg | INTRAMUSCULAR | Status: DC | PRN

## 2018-10-12 MED ORDER — METHOCARBAMOL 500 MG IVPB - SIMPLE MED
500.0000 mg | Freq: Four times a day (QID) | INTRAVENOUS | Status: DC | PRN
  Administered 2018-10-12: 500 mg via INTRAVENOUS
  Filled 2018-10-12: qty 50

## 2018-10-12 MED ORDER — BUPIVACAINE-EPINEPHRINE (PF) 0.25% -1:200000 IJ SOLN
INTRAMUSCULAR | Status: DC | PRN
  Administered 2018-10-12: 30 mL via PERINEURAL

## 2018-10-12 MED ORDER — DEXAMETHASONE SODIUM PHOSPHATE 10 MG/ML IJ SOLN
INTRAMUSCULAR | Status: AC
  Filled 2018-10-12: qty 1

## 2018-10-12 MED ORDER — METHOCARBAMOL 500 MG IVPB - SIMPLE MED
INTRAVENOUS | Status: AC
  Filled 2018-10-12: qty 50

## 2018-10-12 MED ORDER — ALBUMIN HUMAN 5 % IV SOLN
INTRAVENOUS | Status: AC
  Filled 2018-10-12: qty 250

## 2018-10-12 MED ORDER — MORPHINE SULFATE (PF) 4 MG/ML IV SOLN
0.5000 mg | INTRAVENOUS | Status: DC | PRN
  Administered 2018-10-12 – 2018-10-13 (×2): 1 mg via INTRAVENOUS
  Filled 2018-10-12: qty 1

## 2018-10-12 MED ORDER — EPHEDRINE 5 MG/ML INJ
INTRAVENOUS | Status: AC
  Filled 2018-10-12: qty 10

## 2018-10-12 MED ORDER — METOCLOPRAMIDE HCL 5 MG/ML IJ SOLN: 5.0000 mg | Freq: Three times a day (TID) | INTRAMUSCULAR | Status: DC | PRN

## 2018-10-12 MED ORDER — TRANEXAMIC ACID-NACL 1000-0.7 MG/100ML-% IV SOLN
1000.0000 mg | Freq: Once | INTRAVENOUS | Status: AC
  Administered 2018-10-13: 1000 mg via INTRAVENOUS
  Filled 2018-10-12 (×2): qty 100

## 2018-10-12 MED ORDER — HYDROCODONE-ACETAMINOPHEN 7.5-325 MG PO TABS
1.0000 | ORAL_TABLET | ORAL | Status: DC | PRN
  Administered 2018-10-12 – 2018-10-13 (×2): 2 via ORAL
  Filled 2018-10-12 (×2): qty 2

## 2018-10-12 MED ORDER — FENTANYL CITRATE (PF) 100 MCG/2ML IJ SOLN
INTRAMUSCULAR | Status: AC
  Filled 2018-10-12: qty 2

## 2018-10-12 MED ORDER — PHENYLEPHRINE 40 MCG/ML (10ML) SYRINGE FOR IV PUSH (FOR BLOOD PRESSURE SUPPORT)
PREFILLED_SYRINGE | INTRAVENOUS | Status: AC
  Filled 2018-10-12: qty 10

## 2018-10-12 MED ORDER — THROMBIN 5000 UNITS EX SOLR
OROMUCOSAL | Status: DC | PRN
  Administered 2018-10-12: 16:00:00 via TOPICAL

## 2018-10-12 MED ORDER — PHENOL 1.4 % MT LIQD
1.0000 | OROMUCOSAL | Status: DC | PRN
  Filled 2018-10-12: qty 177

## 2018-10-12 MED ORDER — MORPHINE SULFATE (PF) 4 MG/ML IV SOLN
INTRAVENOUS | Status: AC
  Filled 2018-10-12: qty 1

## 2018-10-12 MED ORDER — VERAPAMIL HCL 80 MG PO TABS
80.0000 mg | ORAL_TABLET | Freq: Every day | ORAL | Status: DC | PRN
  Filled 2018-10-12 (×2): qty 1

## 2018-10-12 MED ORDER — FLEET ENEMA 7-19 GM/118ML RE ENEM: 1.0000 | ENEMA | Freq: Once | RECTAL | Status: DC | PRN

## 2018-10-12 MED ORDER — PHENYLEPHRINE HCL 10 MG/ML IJ SOLN
INTRAMUSCULAR | Status: DC | PRN
  Administered 2018-10-12: 80 ug via INTRAVENOUS

## 2018-10-12 MED ORDER — RIVAROXABAN 10 MG PO TABS
10.0000 mg | ORAL_TABLET | Freq: Every day | ORAL | Status: DC
  Administered 2018-10-13: 10 mg via ORAL
  Filled 2018-10-12: qty 1

## 2018-10-12 MED ORDER — MIDAZOLAM HCL 5 MG/5ML IJ SOLN
INTRAMUSCULAR | Status: DC | PRN
  Administered 2018-10-12: 1 mg via INTRAVENOUS
  Administered 2018-10-12: 2 mg via INTRAVENOUS
  Administered 2018-10-12: 1 mg via INTRAVENOUS

## 2018-10-12 MED ORDER — OXYCODONE HCL 5 MG PO TABS: 5.0000 mg | ORAL_TABLET | Freq: Once | ORAL | Status: DC | PRN

## 2018-10-12 MED ORDER — LACTATED RINGERS IV SOLN
INTRAVENOUS | Status: DC
  Administered 2018-10-12 (×3): via INTRAVENOUS

## 2018-10-12 MED ORDER — MIDAZOLAM HCL 2 MG/2ML IJ SOLN
INTRAMUSCULAR | Status: AC
  Filled 2018-10-12: qty 2

## 2018-10-12 MED ORDER — DIPHENHYDRAMINE HCL 12.5 MG/5ML PO ELIX
12.5000 mg | ORAL_SOLUTION | ORAL | Status: DC | PRN
  Administered 2018-10-13: 25 mg via ORAL
  Filled 2018-10-12: qty 10

## 2018-10-12 MED ORDER — CEFAZOLIN SODIUM-DEXTROSE 2-4 GM/100ML-% IV SOLN
2.0000 g | Freq: Four times a day (QID) | INTRAVENOUS | Status: AC
  Administered 2018-10-13 (×2): 2 g via INTRAVENOUS
  Filled 2018-10-12 (×2): qty 100

## 2018-10-12 MED ORDER — METHOCARBAMOL 500 MG PO TABS
500.0000 mg | ORAL_TABLET | Freq: Four times a day (QID) | ORAL | Status: DC | PRN
  Administered 2018-10-13 (×2): 500 mg via ORAL
  Filled 2018-10-12 (×2): qty 1

## 2018-10-12 MED ORDER — ACETAMINOPHEN 10 MG/ML IV SOLN
1000.0000 mg | Freq: Four times a day (QID) | INTRAVENOUS | Status: DC
  Administered 2018-10-12: 1000 mg via INTRAVENOUS
  Filled 2018-10-12: qty 100

## 2018-10-12 MED ORDER — DEXAMETHASONE SODIUM PHOSPHATE 10 MG/ML IJ SOLN
8.0000 mg | Freq: Once | INTRAMUSCULAR | Status: AC
  Administered 2018-10-12: 8 mg via INTRAVENOUS

## 2018-10-12 MED ORDER — ALBUMIN HUMAN 5 % IV SOLN
INTRAVENOUS | Status: DC | PRN
  Administered 2018-10-12 (×2): via INTRAVENOUS

## 2018-10-12 MED ORDER — ONDANSETRON HCL 4 MG/2ML IJ SOLN
INTRAMUSCULAR | Status: AC
  Filled 2018-10-12: qty 2

## 2018-10-12 MED ORDER — METOCLOPRAMIDE HCL 5 MG PO TABS: 5.0000 mg | ORAL_TABLET | Freq: Three times a day (TID) | ORAL | Status: DC | PRN

## 2018-10-12 MED ORDER — DEXAMETHASONE SODIUM PHOSPHATE 10 MG/ML IJ SOLN
10.0000 mg | Freq: Once | INTRAMUSCULAR | Status: AC
  Administered 2018-10-13: 10 mg via INTRAVENOUS
  Filled 2018-10-12: qty 1

## 2018-10-12 MED ORDER — SUMATRIPTAN 20 MG/ACT NA SOLN
20.0000 mg | NASAL | Status: DC | PRN
  Filled 2018-10-12: qty 1

## 2018-10-12 MED ORDER — MENTHOL 3 MG MT LOZG: 1.0000 | LOZENGE | OROMUCOSAL | Status: DC | PRN

## 2018-10-12 MED ORDER — BISACODYL 10 MG RE SUPP: 10.0000 mg | Freq: Every day | RECTAL | Status: DC | PRN

## 2018-10-12 MED ORDER — ONDANSETRON HCL 4 MG/2ML IJ SOLN: 4.0000 mg | Freq: Four times a day (QID) | INTRAMUSCULAR | Status: DC | PRN

## 2018-10-12 MED ORDER — TRAMADOL HCL 50 MG PO TABS
50.0000 mg | ORAL_TABLET | Freq: Four times a day (QID) | ORAL | Status: DC | PRN
  Administered 2018-10-13: 100 mg via ORAL
  Filled 2018-10-12: qty 2

## 2018-10-12 MED ORDER — BUPIVACAINE-EPINEPHRINE (PF) 0.25% -1:200000 IJ SOLN
INTRAMUSCULAR | Status: AC
  Filled 2018-10-12: qty 30

## 2018-10-12 SURGICAL SUPPLY — 46 items
BAG DECANTER FOR FLEXI CONT (MISCELLANEOUS) ×3 IMPLANT
BAG SPEC THK2 15X12 ZIP CLS (MISCELLANEOUS)
BAG ZIPLOCK 12X15 (MISCELLANEOUS) IMPLANT
BLADE SAG 18X100X1.27 (BLADE) ×3 IMPLANT
BLADE SURG SZ10 CARB STEEL (BLADE) ×6 IMPLANT
CLOSURE STERI-STRIP 1/2X4 (GAUZE/BANDAGES/DRESSINGS) ×1
CLOSURE WOUND 1/2 X4 (GAUZE/BANDAGES/DRESSINGS) ×1
CLSR STERI-STRIP ANTIMIC 1/2X4 (GAUZE/BANDAGES/DRESSINGS) ×1 IMPLANT
COVER PERINEAL POST (MISCELLANEOUS) ×3 IMPLANT
COVER SURGICAL LIGHT HANDLE (MISCELLANEOUS) ×3 IMPLANT
COVER WAND RF STERILE (DRAPES) IMPLANT
CUP ACETBLR 52 OD PINNACLE (Hips) ×2 IMPLANT
DECANTER SPIKE VIAL GLASS SM (MISCELLANEOUS) ×3 IMPLANT
DRAPE STERI IOBAN 125X83 (DRAPES) ×3 IMPLANT
DRAPE U-SHAPE 47X51 STRL (DRAPES) ×6 IMPLANT
DRSG ADAPTIC 3X8 NADH LF (GAUZE/BANDAGES/DRESSINGS) ×3 IMPLANT
DRSG MEPILEX BORDER 4X4 (GAUZE/BANDAGES/DRESSINGS) ×3 IMPLANT
DRSG MEPILEX BORDER 4X8 (GAUZE/BANDAGES/DRESSINGS) ×3 IMPLANT
DURAPREP 26ML APPLICATOR (WOUND CARE) ×3 IMPLANT
ELECT REM PT RETURN 15FT ADLT (MISCELLANEOUS) ×3 IMPLANT
EVACUATOR 1/8 PVC DRAIN (DRAIN) ×3 IMPLANT
GLOVE BIO SURGEON STRL SZ7 (GLOVE) ×3 IMPLANT
GLOVE BIO SURGEON STRL SZ8 (GLOVE) ×3 IMPLANT
GLOVE BIOGEL PI IND STRL 7.0 (GLOVE) ×1 IMPLANT
GLOVE BIOGEL PI IND STRL 8 (GLOVE) ×1 IMPLANT
GLOVE BIOGEL PI INDICATOR 7.0 (GLOVE) ×2
GLOVE BIOGEL PI INDICATOR 8 (GLOVE) ×2
GOWN STRL REUS W/TWL LRG LVL3 (GOWN DISPOSABLE) ×3 IMPLANT
GOWN STRL REUS W/TWL XL LVL3 (GOWN DISPOSABLE) ×3 IMPLANT
HEAD FEMORAL 32 CERAMIC (Hips) ×2 IMPLANT
HOLDER FOLEY CATH W/STRAP (MISCELLANEOUS) ×3 IMPLANT
LINER MARATHON NEUT +4X52X32 (Hips) ×2 IMPLANT
MANIFOLD NEPTUNE II (INSTRUMENTS) ×3 IMPLANT
PACK ANTERIOR HIP CUSTOM (KITS) ×3 IMPLANT
SPONGE SURGIFOAM ABS GEL 100 (HEMOSTASIS) ×2 IMPLANT
STEM FEM ACTIS HIGH SZ7 (Stem) ×2 IMPLANT
STRIP CLOSURE SKIN 1/2X4 (GAUZE/BANDAGES/DRESSINGS) ×2 IMPLANT
SUT ETHIBOND NAB CT1 #1 30IN (SUTURE) ×3 IMPLANT
SUT MNCRL AB 4-0 PS2 18 (SUTURE) ×3 IMPLANT
SUT STRATAFIX 0 PDS 27 VIOLET (SUTURE) ×3
SUT VIC AB 2-0 CT1 27 (SUTURE) ×6
SUT VIC AB 2-0 CT1 TAPERPNT 27 (SUTURE) ×2 IMPLANT
SUTURE STRATFX 0 PDS 27 VIOLET (SUTURE) ×1 IMPLANT
SYR 50ML LL SCALE MARK (SYRINGE) IMPLANT
TRAY FOLEY MTR SLVR 16FR STAT (SET/KITS/TRAYS/PACK) ×3 IMPLANT
YANKAUER SUCT BULB TIP 10FT TU (MISCELLANEOUS) ×3 IMPLANT

## 2018-10-12 NOTE — Interval H&P Note (Signed)
History and Physical Interval Note:  10/12/2018 1:09 PM  Samuel Hardy  has presented today for surgery, with the diagnosis of right hip avascular necrosis  The various methods of treatment have been discussed with the patient and family. After consideration of risks, benefits and other options for treatment, the patient has consented to  Procedure(s) with comments: TOTAL HIP ARTHROPLASTY ANTERIOR APPROACH (Right) - 100min as a surgical intervention .  The patient's history has been reviewed, patient examined, no change in status, stable for surgery.  I have reviewed the patient's chart and labs.  Questions were answered to the patient's satisfaction.     Homero FellersFrank Tukker Byrns

## 2018-10-12 NOTE — Anesthesia Postprocedure Evaluation (Signed)
Anesthesia Post Note  Patient: Samuel Hardy  Procedure(s) Performed: TOTAL HIP ARTHROPLASTY ANTERIOR APPROACH (Right Hip)     Patient location during evaluation: PACU Anesthesia Type: Spinal Level of consciousness: awake and alert Pain management: pain level controlled Vital Signs Assessment: post-procedure vital signs reviewed and stable Respiratory status: spontaneous breathing and respiratory function stable Cardiovascular status: blood pressure returned to baseline and stable Postop Assessment: spinal receding and no apparent nausea or vomiting Anesthetic complications: no    Last Vitals:  Vitals:   10/12/18 1750 10/12/18 1800  BP:  94/60  Pulse: 77 66  Resp: 15 16  Temp:    SpO2: 100% 100%    Last Pain:  Vitals:   10/12/18 1750  TempSrc:   PainSc: 5                  Beryle Lathehomas E Brock

## 2018-10-12 NOTE — Transfer of Care (Signed)
Immediate Anesthesia Transfer of Care Note  Patient: Samuel Hardy  Procedure(s) Performed: TOTAL HIP ARTHROPLASTY ANTERIOR APPROACH (Right Hip)  Patient Location: PACU  Anesthesia Type:Spinal  Level of Consciousness: awake, alert  and oriented  Airway & Oxygen Therapy: Patient Spontanous Breathing and Patient connected to face mask oxygen  Post-op Assessment: Report given to RN and Post -op Vital signs reviewed and stable  Post vital signs: Reviewed and stable  Last Vitals:  Vitals Value Taken Time  BP    Temp    Pulse    Resp    SpO2      Last Pain:  Vitals:   10/12/18 1230  TempSrc:   PainSc: 0-No pain         Complications: No apparent anesthesia complications

## 2018-10-12 NOTE — Anesthesia Procedure Notes (Signed)
Spinal  Patient location during procedure: OR Start time: 10/12/2018 3:12 PM End time: 10/12/2018 3:16 PM Staffing Anesthesiologist: Beryle LatheBrock, Thomas E, MD Performed: anesthesiologist  Preanesthetic Checklist Completed: patient identified, surgical consent, pre-op evaluation, timeout performed, IV checked, risks and benefits discussed and monitors and equipment checked Spinal Block Patient position: sitting Prep: DuraPrep Patient monitoring: heart rate, cardiac monitor, continuous pulse ox and blood pressure Approach: midline Location: L3-4 Injection technique: single-shot Needle Needle type: Pencan  Needle gauge: 24 G Additional Notes Consent was obtained prior to the procedure with all questions answered and concerns addressed. Risks including, but not limited to, bleeding, infection, nerve damage, paralysis, failed block, inadequate analgesia, allergic reaction, high spinal, itching, and headache were discussed and the patient wished to proceed. Functioning IV was confirmed and monitors were applied. Sterile prep and drape, including hand hygiene, mask, and sterile gloves were used. The patient was positioned and the spine was prepped. The skin was anesthetized with lidocaine. Free flow of clear CSF was obtained prior to injecting local anesthetic into the CSF. The spinal needle aspirated freely following injection. The needle was carefully withdrawn. The patient tolerated the procedure well.   Leslye Peerhomas Brock, MD

## 2018-10-12 NOTE — Discharge Instructions (Addendum)
°Dr. Frank Aluisio °Total Joint Specialist °[facility] °[address] °[place], [place] [address] °[phone] ° °ANTERIOR APPROACH TOTAL HIP REPLACEMENT POSTOPERATIVE DIRECTIONS ° ° °Hip Rehabilitation, Guidelines Following Surgery  °The results of a hip operation are greatly improved after range of motion and muscle strengthening exercises. Follow all safety measures which are given to protect your hip. If any of these exercises cause increased pain or swelling in your joint, decrease the amount until you are comfortable again. Then slowly increase the exercises. Call your caregiver if you have problems or questions.  ° °HOME CARE INSTRUCTIONS  °• Remove items at home which could result in a fall. This includes throw rugs or furniture in walking pathways.  °· ICE to the affected hip every three hours for 30 minutes at a time and then as needed for pain and swelling.  Continue to use ice on the hip for pain and swelling from surgery. You may notice swelling that will progress down to the foot and ankle.  This is normal after surgery.  Elevate the leg when you are not up walking on it.   °· Continue to use the breathing machine which will help keep your temperature down.  It is common for your temperature to cycle up and down following surgery, especially at night when you are not up moving around and exerting yourself.  The breathing machine keeps your lungs expanded and your temperature down. ° °DIET °You may resume your previous home diet once your are discharged from the hospital. ° °DRESSING / WOUND CARE / SHOWERING °You may shower 3 days after surgery, but keep the wounds dry during showering.  You may use an occlusive plastic wrap (Press'n Seal for example), NO SOAKING/SUBMERGING IN THE BATHTUB.  If the bandage gets wet, change with a clean dry gauze.  If the incision gets wet, pat the wound dry with a clean towel. °You may start showering once you are discharged home but do not submerge the  incision under water. Just pat the incision dry and apply a dry gauze dressing on daily. °Change the surgical dressing daily and reapply a dry dressing each time. ° °ACTIVITY °Walk with your walker as instructed. °Use walker as long as suggested by your caregivers. °Avoid periods of inactivity such as sitting longer than an hour when not asleep. This helps prevent blood clots.  °You may resume a sexual relationship in one month or when given the OK by your doctor.  °You may return to work once you are cleared by your doctor.  °Do not drive a car for 6 weeks or until released by you surgeon.  °Do not drive while taking narcotics. ° °WEIGHT BEARING °Weight bearing as tolerated with assist device (walker, cane, etc) as directed, use it as long as suggested by your surgeon or therapist, typically at least 4-6 weeks. ° °POSTOPERATIVE CONSTIPATION PROTOCOL °Constipation - defined medically as fewer than three stools per week and severe constipation as less than one stool per week. ° °One of the most common issues patients have following surgery is constipation.  Even if you have a regular bowel pattern at home, your normal regimen is likely to be disrupted due to multiple reasons following surgery.  Combination of anesthesia, postoperative narcotics, change in appetite and fluid intake all can affect your bowels.  In order to avoid complications following surgery, here are some recommendations in order to help you during your recovery period. ° °Colace (docusate) - Pick up an over-the-counter form   of Colace or another stool softener and take twice a day as long as you are requiring postoperative pain medications.  Take with a full glass of water daily.  If you experience loose stools or diarrhea, hold the colace until you stool forms back up.  If your symptoms do not get better within 1 week or if they get worse, check with your doctor. ° °Dulcolax (bisacodyl) - Pick up over-the-counter and take as directed by the product  packaging as needed to assist with the movement of your bowels.  Take with a full glass of water.  Use this product as needed if not relieved by Colace only.  ° °MiraLax (polyethylene glycol) - Pick up over-the-counter to have on hand.  MiraLax is a solution that will increase the amount of water in your bowels to assist with bowel movements.  Take as directed and can mix with a glass of water, juice, soda, coffee, or tea.  Take if you go more than two days without a movement. °Do not use MiraLax more than once per day. Call your doctor if you are still constipated or irregular after using this medication for 7 days in a row. ° °If you continue to have problems with postoperative constipation, please contact the office for further assistance and recommendations.  If you experience "the worst abdominal pain ever" or develop nausea or vomiting, please contact the office immediatly for further recommendations for treatment. ° °ITCHING ° If you experience itching with your medications, try taking only a single pain pill, or even half a pain pill at a time.  You can also use Benadryl over the counter for itching or also to help with sleep.  ° °TED HOSE STOCKINGS °Wear the elastic stockings on both legs for three weeks following surgery during the day but you may remove then at night for sleeping. ° °MEDICATIONS °See your medication summary on the “After Visit Summary” that the nursing staff will review with you prior to discharge.  You may have some home medications which will be placed on hold until you complete the course of blood thinner medication.  It is important for you to complete the blood thinner medication as prescribed by your surgeon.  Continue your approved medications as instructed at time of discharge. ° °PRECAUTIONS °If you experience chest pain or shortness of breath - call 911 immediately for transfer to the hospital emergency department.  °If you develop a fever greater that 101 F, purulent drainage  from wound, increased redness or drainage from wound, foul odor from the wound/dressing, or calf pain - CONTACT YOUR SURGEON.   °                                                °FOLLOW-UP APPOINTMENTS °Make sure you keep all of your appointments after your operation with your surgeon and caregivers. You should call the office at the above phone number and make an appointment for approximately two weeks after the date of your surgery or on the date instructed by your surgeon outlined in the "After Visit Summary". ° °RANGE OF MOTION AND STRENGTHENING EXERCISES  °These exercises are designed to help you keep full movement of your hip joint. Follow your caregiver's or physical therapist's instructions. Perform all exercises about fifteen times, three times per day or as directed. Exercise both hips, even if you have   had only one joint replacement. These exercises can be done on a training (exercise) mat, on the floor, on a table or on a bed. Use whatever works the best and is most comfortable for you. Use music or television while you are exercising so that the exercises are a pleasant break in your day. This will make your life better with the exercises acting as a break in routine you can look forward to.  °• Lying on your back, slowly slide your foot toward your buttocks, raising your knee up off the floor. Then slowly slide your foot back down until your leg is straight again.  °• Lying on your back spread your legs as far apart as you can without causing discomfort.  °• Lying on your side, raise your upper leg and foot straight up from the floor as far as is comfortable. Slowly lower the leg and repeat.  °• Lying on your back, tighten up the muscle in the front of your thigh (quadriceps muscles). You can do this by keeping your leg straight and trying to raise your heel off the floor. This helps strengthen the largest muscle supporting your knee.  °• Lying on your back, tighten up the muscles of your buttocks both  with the legs straight and with the knee bent at a comfortable angle while keeping your heel on the floor.  ° °IF YOU ARE TRANSFERRED TO A SKILLED REHAB FACILITY °If the patient is transferred to a skilled rehab facility following release from the hospital, a list of the current medications will be sent to the facility for the patient to continue.  When discharged from the skilled rehab facility, please have the facility set up the patient's Home Health Physical Therapy prior to being released. Also, the skilled facility will be responsible for providing the patient with their medications at time of release from the facility to include their pain medication, the muscle relaxants, and their blood thinner medication. If the patient is still at the rehab facility at time of the two week follow up appointment, the skilled rehab facility will also need to assist the patient in arranging follow up appointment in our office and any transportation needs. ° °MAKE SURE YOU:  °• Understand these instructions.  °• Get help right away if you are not doing well or get worse.  ° ° °Pick up stool softner and laxative for home use following surgery while on pain medications. °Do not submerge incision under water. °Please use good hand washing techniques while changing dressing each day. °May shower starting three days after surgery. °Please use a clean towel to pat the incision dry following showers. °Continue to use ice for pain and swelling after surgery. °Do not use any lotions or creams on the incision until instructed by your surgeon. ° °Information on my medicine - XARELTO® (Rivaroxaban) ° °Why was Xarelto® prescribed for you? °Xarelto® was prescribed for you to reduce the risk of blood clots forming after orthopedic surgery. The medical term for these abnormal blood clots is venous thromboembolism (VTE). ° °What do you need to know about xarelto® ? °Take your Xarelto® ONCE DAILY at the same time every day. °You may take it  either with or without food. ° °If you have difficulty swallowing the tablet whole, you may crush it and mix in applesauce just prior to taking your dose. ° °Take Xarelto® exactly as prescribed by your doctor and DO NOT stop taking Xarelto® without talking to the doctor who prescribed the medication.    Stopping without other VTE prevention medication to take the place of Xarelto® may increase your risk of developing a clot. ° °After discharge, you should have regular check-up appointments with your healthcare provider that is prescribing your Xarelto®.   ° °What do you do if you miss a dose? °If you miss a dose, take it as soon as you remember on the same day then continue your regularly scheduled once daily regimen the next day. Do not take two doses of Xarelto® on the same day.  ° °Important Safety Information °A possible side effect of Xarelto® is bleeding. You should call your healthcare provider right away if you experience any of the following: °? Bleeding from an injury or your nose that does not stop. °? Unusual colored urine (red or dark brown) or unusual colored stools (red or black). °? Unusual bruising for unknown reasons. °? A serious fall or if you hit your head (even if there is no bleeding). ° °Some medicines may interact with Xarelto® and might increase your risk of bleeding while on Xarelto®. To help avoid this, consult your healthcare provider or pharmacist prior to using any new prescription or non-prescription medications, including herbals, vitamins, non-steroidal anti-inflammatory drugs (NSAIDs) and supplements. ° °This website has more information on Xarelto®: www.xarelto.com. ° ° ° °

## 2018-10-12 NOTE — Anesthesia Preprocedure Evaluation (Addendum)
Anesthesia Evaluation  Patient identified by MRN, date of birth, ID band Patient awake    Reviewed: Allergy & Precautions, NPO status , Patient's Chart, lab work & pertinent test results  History of Anesthesia Complications Negative for: history of anesthetic complications  Airway Mallampati: II  TM Distance: >3 FB Neck ROM: Full    Dental  (+) Dental Advisory Given, Teeth Intact   Pulmonary former smoker,    breath sounds clear to auscultation       Cardiovascular negative cardio ROS   Rhythm:Regular Rate:Normal     Neuro/Psych  Headaches (cluster headaches), PSYCHIATRIC DISORDERS Anxiety    GI/Hepatic Neg liver ROS, GERD  Controlled and Medicated,  Endo/Other  negative endocrine ROS  Renal/GU negative Renal ROS     Musculoskeletal  Avascular necrosis right hip    Abdominal   Peds  Hematology negative hematology ROS (+)   Anesthesia Other Findings   Reproductive/Obstetrics                            Anesthesia Physical Anesthesia Plan  ASA: II  Anesthesia Plan: Spinal   Post-op Pain Management:    Induction:   PONV Risk Score and Plan: 1 and Treatment may vary due to age or medical condition and Propofol infusion  Airway Management Planned: Natural Airway and Simple Face Mask  Additional Equipment: None  Intra-op Plan:   Post-operative Plan:   Informed Consent: I have reviewed the patients History and Physical, chart, labs and discussed the procedure including the risks, benefits and alternatives for the proposed anesthesia with the patient or authorized representative who has indicated his/her understanding and acceptance.     Plan Discussed with: CRNA and Anesthesiologist  Anesthesia Plan Comments: (Labs reviewed, platelets acceptable. Discussed risks and benefits of spinal, including spinal/epidural hematoma, infection, failed block, and PDPH. Patient expressed  understanding and wished to proceed. )       Anesthesia Quick Evaluation

## 2018-10-12 NOTE — Op Note (Signed)
OPERATIVE REPORT- TOTAL HIP ARTHROPLASTY   PREOPERATIVE DIAGNOSIS: Avascular necrosis of the Right hip.   POSTOPERATIVE DIAGNOSIS: Avascular necrosis of the Right  hip.   PROCEDURE: Right total hip arthroplasty, anterior approach.   SURGEON: Ollen GrossFrank Kalima Saylor, MD   ASSISTANT: Dimitri PedAmber Constable, PA-C  ANESTHESIA:  Spinal  ESTIMATED BLOOD LOSS:-1100 mL    DRAINS: Hemovac x1.   COMPLICATIONS: None   CONDITION: PACU - hemodynamically stable.   BRIEF CLINICAL NOTE: Samuel FileJamie A Hardy is a 47 y.o. 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   PROCEDURE IN DETAIL: After successful administration of spinal  anesthetic, the traction boots [facility] the [facility] were placed on both  feet and the [facility] was [facility], boots placed into the leg  holders. The Right hip was then isolated from the perineum with plastic  drapes and prepped and draped in the usual sterile fashion. ASIS and  greater trochanter were marked and a oblique incision was made, starting  at about 1 cm lateral and 2 cm distal to the ASIS and coursing towards  the anterior cortex of the femur. The skin was cut with a 10 blade  through subcutaneous tissue to the level of the fascia overlying the  tensor fascia lata muscle. The fascia was then incised in line with the  incision at the junction of the anterior third and posterior 2/3rd. The  muscle was teased off the fascia and then the interval between the TFL  and the rectus was developed. The Hohmann retractor was then placed at  the top of the femoral neck over the capsule. The vessels overlying the  capsule were cauterized and the fat on top of the capsule was removed.  A Hohmann retractor was then placed anterior underneath the rectus  femoris to give exposure to the entire anterior capsule. A T-shaped   capsulotomy was performed. The edges were tagged and the femoral head  was identified.       Osteophytes are removed off the superior acetabulum.  The femoral neck was then cut in situ with an oscillating saw. Traction  was then applied to the left lower extremity utilizing the [facility]  traction. The femoral head was then removed. Retractors were placed  around the acetabulum and then circumferential removal of the labrum was  performed. Osteophytes were also removed. Reaming starts at 49 mm to  medialize and  Increased in 2 mm increments to 51 mm. We reamed in  approximately 40 degrees of abduction, 20 degrees anteversion. A 52 mm  pinnacle acetabular shell was then impacted in anatomic position under  fluoroscopic guidance with excellent purchase. We did not need to place  any additional dome screws. A 32 mm neutral + 4 marathon liner was then  placed into the acetabular shell.       The femoral lift was then placed along the lateral aspect of the femur  just distal to the vastus ridge. The leg was  externally rotated and capsule  was stripped off the inferior aspect of the femoral neck down to the  level of the lesser trochanter, this was done with electrocautery. The femur was lifted after this was performed. The  leg was then placed in an extended and adducted position essentially delivering the femur. We also removed the capsule superiorly and the piriformis from the piriformis  fossa to gain excellent exposure of the  proximal femur. Rongeur was used to remove some cancellous bone to get  into the lateral portion of the proximal femur for placement of the  initial starter reamer. The starter broaches was placed  the starter broach  and was shown to go down the center of the canal. Broaching  with the Actis system was then performed starting at size 0  coursing  Up to size 7. A size 7 had excellent torsional and rotational  and axial stability. The trial high offset neck was then placed   with a 32 + 1 trial head. The hip was then reduced. We confirmed that  the stem was in the canal both on AP and lateral x-rays. It also has excellent sizing. The hip was reduced with outstanding stability through full extension and full external rotation.. AP pelvis was taken and the leg lengths were measured and found to be equal. Hip was then dislocated again and the femoral head and neck removed. The  femoral broach was removed. Size 7 Actis stem with a high offset  neck was then impacted into the femur following native anteversion. Has  excellent purchase in the canal. Excellent torsional and rotational and  axial stability. It is confirmed to be in the canal on AP and lateral  fluoroscopic views. The 32 + 1 ceramic head was placed and the hip  reduced with outstanding stability. Again AP pelvis was taken and it  confirmed that the leg lengths were equal. The wound was then copiously  irrigated with saline solution and the capsule reattached and repaired  with Ethibond suture. 30 ml of .25% Bupivicaine was  injected into the capsule and into the edge of the tensor fascia lata as well as subcutaneous tissue. The fascia overlying the tensor fascia lata was then closed with a running #1 V-Loc. Subcu was closed with interrupted 2-0 Vicryl and subcuticular running 4-0 Monocryl. Incision was cleaned  and dried. Steri-Strips and a bulky sterile dressing applied. Hemovac  drain was hooked to suction and then the patient was awakened and transported to  recovery in stable condition.        Please note that a surgical assistant was a medical necessity for this procedure to perform it in a safe and expeditious manner. Assistant was necessary to provide appropriate retraction of vital neurovascular structures and to prevent femoral fracture and allow for anatomic placement of the prosthesis.  Gaynelle Arabian, M.D.

## 2018-10-13 ENCOUNTER — Other Ambulatory Visit: Payer: Self-pay

## 2018-10-13 ENCOUNTER — Encounter (HOSPITAL_COMMUNITY): Payer: Self-pay | Admitting: Orthopedic Surgery

## 2018-10-13 LAB — CBC
HCT: 32.2 % — ABNORMAL LOW (ref 39.0–52.0)
Hemoglobin: 11 g/dL — ABNORMAL LOW (ref 13.0–17.0)
MCH: 32.6 pg (ref 26.0–34.0)
MCHC: 34.2 g/dL (ref 30.0–36.0)
MCV: 95.5 fL (ref 80.0–100.0)
Platelets: 177 10*3/uL (ref 150–400)
RBC: 3.37 MIL/uL — ABNORMAL LOW (ref 4.22–5.81)
RDW: 11.9 % (ref 11.5–15.5)
WBC: 9.3 10*3/uL (ref 4.0–10.5)
nRBC: 0 % (ref 0.0–0.2)

## 2018-10-13 LAB — BASIC METABOLIC PANEL
Anion gap: 7 (ref 5–15)
BUN: 8 mg/dL (ref 6–20)
CO2: 24 mmol/L (ref 22–32)
CREATININE: 1.1 mg/dL (ref 0.61–1.24)
Calcium: 8.6 mg/dL — ABNORMAL LOW (ref 8.9–10.3)
Chloride: 106 mmol/L (ref 98–111)
GFR calc Af Amer: >60 mL/min (ref >60)
GFR calc non Af Amer: >60 mL/min (ref >60)
Glucose, Bld: 172 mg/dL — ABNORMAL HIGH (ref 70–99)
Potassium: 4.2 mmol/L (ref 3.5–5.1)
Sodium: 137 mmol/L (ref 135–145)

## 2018-10-13 MED ORDER — HYDROCODONE-ACETAMINOPHEN 5-325 MG PO TABS: 1.0000 | ORAL_TABLET | Freq: Four times a day (QID) | ORAL | 0 refills | Status: AC | PRN

## 2018-10-13 MED ORDER — METHOCARBAMOL 500 MG PO TABS: 500.0000 mg | ORAL_TABLET | Freq: Four times a day (QID) | ORAL | 0 refills | Status: AC | PRN

## 2018-10-13 MED ORDER — TRAMADOL HCL 50 MG PO TABS: 50.0000 mg | ORAL_TABLET | Freq: Four times a day (QID) | ORAL | 0 refills | Status: AC | PRN

## 2018-10-13 MED ORDER — RIVAROXABAN 10 MG PO TABS: 10.0000 mg | ORAL_TABLET | Freq: Every day | ORAL | 0 refills | Status: AC

## 2018-10-13 MED FILL — XARELTO 10 MG TABLET: 10 | 20 days supply | Qty: 20 | Fill #0

## 2018-10-13 MED FILL — traMADol HCL 50 MG TABS: 50 | 5 days supply | Qty: 40 | Fill #0

## 2018-10-13 MED FILL — METHOCARBAMOL 500 MG TABLET: 500 | 10 days supply | Qty: 40 | Fill #0

## 2018-10-13 MED FILL — HYDROCODON-APAP 5-325: 5-325 | 7 days supply | Qty: 56 | Fill #0

## 2018-10-13 NOTE — Evaluation (Signed)
Physical Therapy Evaluation Patient Details Name: Samuel Hardy MRN: [id number] DOB: [birth year] Today's Date: 10/13/2018   History of Present Illness  s/p R THA  Clinical Impression  Pt is s/p THA resulting in the deficits listed below (see PT Problem List). Pt will benefit from skilled PT to increase their independence and safety with mobility to allow discharge to the venue listed below.      Follow Up Recommendations No PT follow up;Follow surgeon's recommendation for DC plan and follow-up therapies    Equipment Recommendations  None recommended by PT    Recommendations for Other Services       Precautions / Restrictions Restrictions Weight Bearing Restrictions: No      Mobility  Bed Mobility Overal bed mobility: Needs Assistance Bed Mobility: Supine to Sit     Supine to sit: Min assist;HOB elevated     General bed mobility comments: cues for technique, assist to initiate movement RLE, abel to begin self assist with LLE under Right  Transfers Overall transfer level: Needs assistance Equipment used: Rolling walker (2 wheeled) Transfers: Sit to/from Stand Sit to Stand: Min guard;Min assist         General transfer comment: cues for hand placement  Ambulation/Gait Ambulation/Gait assistance: Min guard;Min assist Gait Distance (Feet): 200 Feet Assistive device: Rolling walker (2 wheeled) Gait Pattern/deviations: Step-to pattern;Step-through pattern     General Gait Details: cues for sequence, RW safety, gait progression  Stairs            Wheelchair Mobility    Modified Rankin (Stroke Patients Only)       Balance Overall balance assessment: Mild deficits observed, not formally tested                                           Pertinent Vitals/Pain Pain Assessment: 0-10 Pain Score: 6  Pain Location: right hip Pain Intervention(s): Limited activity within patient's tolerance;Monitored during session    Home Living  Family/patient expects to be discharged to:: Private residence Living Arrangements: Spouse/significant other   Type of Home: House Home Access: Level entry     Home Layout: Multi-level Home Equipment: Crutches;Cane - single point;Bedside commode;Shower seat;Walker - 2 wheels      Prior Function Level of Independence: Independent               Hand Dominance        Extremity/Trunk Assessment   Upper Extremity Assessment Upper Extremity Assessment: Overall WFL for tasks assessed    Lower Extremity Assessment Lower Extremity Assessment: RLE deficits/detail RLE Deficits / Details: AROM limited by pain; strength at hip and knee 3/5; ankle WFL       Communication   Communication: No difficulties  Cognition Arousal/Alertness: Awake/alert Behavior During Therapy: WFL for tasks assessed/performed Overall Cognitive Status: Within Functional Limits for tasks assessed                                        General Comments      Exercises Total Joint Exercises Ankle Circles/Pumps: AROM;Both Quad Sets: AROM;Both Short Arc Quad: AROM;Right;10 reps Heel Slides: AAROM;10 reps;Right Hip ABduction/ADduction: AAROM   Assessment/Plan    PT Assessment Patient needs continued PT services  PT Problem List Decreased strength;Decreased range of motion;Decreased activity tolerance;Decreased mobility;Decreased balance;Decreased knowledge of use  of DME       PT Treatment Interventions Stair training;Gait training;DME instruction;Functional mobility training;Therapeutic activities;Patient/family education;Therapeutic exercise    PT Goals (Current goals can be found in the Care Plan section)  Acute Rehab PT Goals Patient Stated Goal: play golf PT Goal Formulation: With patient Time For Goal Achievement: 10/20/18 Potential to Achieve Goals: Good    Frequency 7X/week   Barriers to discharge        Co-evaluation               AM-PAC PT "6 Clicks"  Mobility  Outcome Measure Help needed turning from your back to your side while in a flat bed without using bedrails?: A Little Help needed moving from lying on your back to sitting on the side of a flat bed without using bedrails?: A Little Help needed moving to and from a bed to a chair (including a wheelchair)?: A Little Help needed standing up from a chair using your arms (e.g., wheelchair or bedside chair)?: A Little Help needed to walk in hospital room?: A Little Help needed climbing 3-5 steps with a railing? : A Little 6 Click Score: 18    End of Session Equipment Utilized During Treatment: Gait belt Activity Tolerance: Patient tolerated treatment well Patient left: in chair;with call bell/phone within reach;with chair alarm set;with family/visitor present   PT Visit Diagnosis: Unsteadiness on feet (R26.81)    Time: [id number] PT Time Calculation (min) (ACUTE ONLY): 34 min   Charges:   PT Evaluation $PT Eval Low Complexity: 1 Low PT Treatments $Gait Training: 8-22 mins          [facility] ScrevenWILLIAMS,Javia Dillow 10/13/2018, 1:08 PM

## 2018-10-13 NOTE — Progress Notes (Signed)
   10/13/18 1400  PT Visit Information--pt doing well; feels ready for d/c, PT in agreement, RN aware  Last PT Received On 10/13/18  Assistance Needed +1  History of Present Illness s/p R THA  Subjective Data  Patient Stated Goal play golf  Restrictions  Weight Bearing Restrictions No  Pain Assessment  Pain Assessment 0-10  Pain Score 4  Pain Location right hip  Pain Descriptors / Indicators Sore;Spasm  Pain Intervention(s) Limited activity within patient's tolerance;Monitored during session;Repositioned;Ice applied  Cognition  Arousal/Alertness Awake/alert  Behavior During Therapy WFL for tasks assessed/performed  Overall Cognitive Status Within Functional Limits for tasks assessed  Transfers  Overall transfer level Needs assistance  Equipment used Rolling walker (2 wheeled)  Transfers Sit to/from Stand  Sit to Stand Supervision  General transfer comment cues for hand placement  Ambulation/Gait  Ambulation/Gait assistance Min guard;Supervision  Gait Distance (Feet) 65 Feet  Assistive device Rolling walker (2 wheeled)  Gait Pattern/deviations Step-to pattern;Step-through pattern  General Gait Details cues for RW position  Stairs Yes  Stairs assistance Min guard  Stair Management One rail Right;One rail Left;Step to pattern;Forwards;With crutches;No rails  Number of Stairs 5 (x2)  General stair comments up/down with rail and crutch, with 2 crutches  PT - End of Session  Equipment Utilized During Treatment Gait belt  Activity Tolerance Patient tolerated treatment well  Patient left in chair;with call bell/phone within reach;with family/visitor present   PT - Assessment/Plan  PT Plan Current plan remains appropriate  PT Visit Diagnosis Unsteadiness on feet (R26.81)  PT Frequency (ACUTE ONLY) 7X/week  Follow Up Recommendations No PT follow up;Follow surgeon's recommendation for DC plan and follow-up therapies  PT equipment None recommended by PT  AM-PAC PT "6 Clicks"  Mobility Outcome Measure (Version 2)  Help needed turning from your back to your side while in a flat bed without using bedrails? 3  Help needed moving from lying on your back to sitting on the side of a flat bed without using bedrails? 3  Help needed moving to and from a bed to a chair (including a wheelchair)? 3  Help needed standing up from a chair using your arms (e.g., wheelchair or bedside chair)? 3  Help needed to walk in hospital room? 3  Help needed climbing 3-5 steps with a railing?  3  6 Click Score 18  Consider [facility]charge [facility]  PT Goal Progression  Progress towards PT goals Progressing toward goals  Acute Rehab PT Goals  PT Goal Formulation With patient  Time For Goal Achievement 10/20/18  Potential to Achieve Goals Good  PT Time Calculation  PT Start Time (ACUTE ONLY) 1221  PT Stop Time (ACUTE ONLY) 1238  PT Time Calculation (min) (ACUTE ONLY) 17 min  PT General Charges  $$ ACUTE PT VISIT 1 Visit  PT Treatments  $Gait Training 8-22 mins

## 2018-10-13 NOTE — Plan of Care (Signed)
Patient discharged home. IV dc'd. Rx given

## 2018-10-13 NOTE — Care Management Note (Signed)
Case Management Note  Patient Details  Name: [name] MRN: [id number] Date of Birth: [birth year]  Subjective/Objective:       Spoke with patient at bedside. Confirmed plan for HEP. Has RW and 3n1. [phone]             Action/Plan:   Expected Discharge Date:  10/13/18               Expected Discharge Plan:  Home/Self Care  In-House Referral:  NA  Discharge planning Services  CM Consult, NA  Post Acute Care Choice:  NA Choice offered to:  Patient, Spouse  DME Arranged:  N/A DME Agency:  NA  HH Arranged:  NA HH Agency:  NA  Status of Service:  Completed, signed off  If discussed at Long Length of Stay Meetings, dates discussed:    Additional Comments:  Alexis Goodelleele, Adam Sanjuan K, RN 10/13/2018, 11:06 AM

## 2018-10-13 NOTE — Progress Notes (Signed)
   Subjective: 1 Day Post-Op Procedure(s) (LRB): TOTAL HIP ARTHROPLASTY ANTERIOR APPROACH (Right) Patient reports pain as mild.   Patient seen in rounds by Dr. Lequita HaltAluisio. Patient is well, and has had no acute complaints or problems. No issues overnight. Foley catheter removed this AM. Denies chest pain or SOB. We will start therapy today.   Objective: Vital signs in last 24 hours: Temp:  [97.4 F (36.3 C)-97.9 F (36.6 C)] 97.7 F (36.5 C) (12/17 0457) Pulse Rate:  [52-77] 58 (12/17 0457) Resp:  [10-18] 18 (12/17 0457) BP: (94-136)/(57-94) 136/81 (12/17 0457) SpO2:  [98 %-100 %] 100 % (12/17 0457) Weight:  [82.6 kg] 82.6 kg (12/16 1230)  Intake/Output from previous day:  Intake/Output Summary (Last 24 hours) at 10/13/2018 0733 Last data filed at 10/13/2018 [id number] Gross per 24 hour  Intake 4124 ml  Output 3275 ml  Net 849 ml     Labs: Recent Labs    10/13/18 0626  HGB 11.0*   Recent Labs    10/13/18 0626  WBC 9.3  RBC 3.37*  HCT 32.2*  PLT 177   Recent Labs    10/13/18 0626  NA 137  K 4.2  CL 106  CO2 24  BUN 8  CREATININE 1.10  GLUCOSE 172*  CALCIUM 8.6*   Exam: General - Patient is Alert and Oriented Extremity - Neurologically intact Neurovascular intact Sensation intact distally Dorsiflexion/Plantar flexion intact Dressing - dressing C/D/I Motor Function - intact, moving foot and toes well on exam.   Past Medical History:  Diagnosis Date  . Cluster headaches   . GERD (gastroesophageal reflux disease)   . Hemorrhoids     Assessment/Plan: 1 Day Post-Op Procedure(s) (LRB): TOTAL HIP ARTHROPLASTY ANTERIOR APPROACH (Right) Principal Problem:   OA (osteoarthritis) of hip  Estimated body mass index is 24.01 kg/m as calculated from the following:   Height as of this encounter: 6\' 1"  (1.854 m).   Weight as of this encounter: 82.6 kg. Advance diet Up with therapy D/C IV fluids  DVT Prophylaxis - Xarelto Weight bearing as tolerated. D/C O2  and pulse ox and try on room air. Hemovac pulled without difficulty, will begin therapy.  Plan is to go Home after hospital stay. Plan for discharge this afternoon with HEP after two sessions of physical therapy if meeting goals. Follow-up in the office in 2 weeks with Dr. Lequita HaltAluisio.  Arther AbbottKristie Hallie Ertl, PA-C Orthopedic Surgery 10/13/2018, 7:33 AM

## 2018-10-14 ENCOUNTER — Other Ambulatory Visit: Payer: Self-pay | Admitting: *Deleted

## 2018-10-14 NOTE — Patient Outreach (Addendum)
[facility] Care Management  10/14/2018  Samuel FileJamie A Hardy 1971/06/26 [id number]   Transition of care telephone call:  Objective:  Samuel.  [facility] for right total arthroplasty using anterior approach related to osteoarthritis of the hip. No other significant  Comorbidities. He was discharged to home on 12/17 without the need for home health services . Subjective: Initial successful telephone call to patient's preferred number in order to complete transition of care assessment;after verifying 2 HIPAA identifiers and explaining purpose of call, Samuel Hardy stated he has just taken some pain medication and directed this RNCM to call his wife Samuel Hardy at cell number [phone] and gave verbal permission for this RNCM to share personal health information with her. Message left on wife's cell phone number. Wife returned call, she states she is a Engineer, civil (consulting)nurse for [employer],  transition of care assessment completed see transition of care template for details. [name] his pain is adequately controlled with the prescribed medications. She states Samuel MuirJamie is voiding well,  has not had a bowel movement but is passing gas and she is giving him over the counter laxative (Miralax) as directed. She reports an unremarkable surgical dressing. Verified the presence of the following durable medical equipment: 3 in 1, rolling walker, and shower chair.  [name] he ambulated to the mailbox using the walker and is doing his home exercise program as prescribed.  Plan:  No care management needs identified so will close case to [facility] Care Management care management services and route successful outreach letter to [facility] Care Management clinical pool to be mailed to patient's home address.    Samuel RichardJanet S. Hauser RN,CCM,CDE [facility] Care Management Coordinator Office Phone [phone] Office Fax [phone]

## 2018-10-15 ENCOUNTER — Ambulatory Visit: Payer: 59 | Admitting: *Deleted

## 2018-10-19 NOTE — Discharge Summary (Signed)
Physician Discharge Summary   Patient ID: Samuel Hardy MRN: [id number] DOB/AGE: October 26, 1971 47 y.o.  Admit date: 10/12/2018 Discharge date: 10/13/2018  Primary Diagnosis: Avascular necrosis, right hip   Admission Diagnoses:  Past Medical History:  Diagnosis Date  . Cluster headaches   . GERD (gastroesophageal reflux disease)   . Hemorrhoids    Discharge Diagnoses:   Principal Problem:   OA (osteoarthritis) of hip  Estimated body mass index is 24.01 kg/m as calculated from the following:   Height as of this encounter: 6\' 1"  (1.854 m).   Weight as of this encounter: 82.6 kg.  Procedure:  Procedure(s) (LRB): TOTAL HIP ARTHROPLASTY ANTERIOR APPROACH (Right)   Consults: None  HPI: Samuel Hardy is a 47 y.o. male who has advanced osteonecrosis of their Right  hip with progressively worsening pain and dysfunction.The patient has failed nonoperative management and presents for  total hip arthroplasty.   Laboratory Data: Admission on 10/12/2018, Discharged on 10/13/2018  Component Date Value Ref Range Status  . WBC 10/13/2018 9.3  4.0 - 10.5 K/uL Final  . RBC 10/13/2018 3.37* 4.22 - 5.81 MIL/uL Final  . Hemoglobin 10/13/2018 11.0* 13.0 - 17.0 g/dL Final  . HCT 95/62/1308 32.2* 39.0 - 52.0 % Final  . MCV 10/13/2018 95.5  80.0 - 100.0 fL Final  . MCH 10/13/2018 32.6  26.0 - 34.0 pg Final  . MCHC 10/13/2018 34.2  30.0 - 36.0 g/dL Final  . RDW 65/78/4696 11.9  11.5 - 15.5 % Final  . Platelets 10/13/2018 177  150 - 400 K/uL Final  . nRBC 10/13/2018 0.0  0.0 - 0.2 % Final   Performed at [facility], [address] [place], [place] [address]  . Sodium 10/13/2018 137  135 - 145 mmol/L Final  . Potassium 10/13/2018 4.2  3.5 - 5.1 mmol/L Final  . Chloride 10/13/2018 106  98 - 111 mmol/L Final  . CO2 10/13/2018 24  22 - 32 mmol/L Final  . Glucose, Bld 10/13/2018 172* 70 - 99 mg/dL Final  . BUN 41/32/4401 8  6 - 20 mg/dL Final  . Creatinine, Ser 10/13/2018  1.10  0.61 - 1.24 mg/dL Final  . Calcium 02/72/5366 8.6* 8.9 - 10.3 mg/dL Final  . GFR calc non Af Amer 10/13/2018 >60  >60 mL/min Final  . GFR calc Af Amer 10/13/2018 >60  >60 mL/min Final  . Anion gap 10/13/2018 7  5 - 15 Final   Performed at [facility], [address], [place], [place] [address]  Hospital Outpatient Visit on 10/09/2018  Component Date Value Ref Range Status  . aPTT 10/09/2018 29  24 - 36 seconds Final   Performed at [facility], [address], [place], [place] [address]  . WBC 10/09/2018 5.3  4.0 - 10.5 K/uL Final  . RBC 10/09/2018 4.76  4.22 - 5.81 MIL/uL Final  . Hemoglobin 10/09/2018 15.3  13.0 - 17.0 g/dL Final  . HCT 56/38/7564 44.8  39.0 - 52.0 % Final  . MCV 10/09/2018 94.1  80.0 - 100.0 fL Final  . MCH 10/09/2018 32.1  26.0 - 34.0 pg Final  . MCHC 10/09/2018 34.2  30.0 - 36.0 g/dL Final  . RDW 33/29/5188 11.9  11.5 - 15.5 % Final  . Platelets 10/09/2018 186  150 - 400 K/uL Final  . nRBC 10/09/2018 0.0  0.0 - 0.2 % Final   Performed at [facility], [address] [place], [place] [address]  .  Sodium 10/09/2018 139  135 - 145 mmol/L Final  . Potassium 10/09/2018 3.7  3.5 - 5.1 mmol/L Final  . Chloride 10/09/2018 105  98 - 111 mmol/L Final  . CO2 10/09/2018 25  22 - 32 mmol/L Final  . Glucose, Bld 10/09/2018 97  70 - 99 mg/dL Final  . BUN 16/07/9603 11  6 - 20 mg/dL Final  . Creatinine, Ser 10/09/2018 1.15  0.61 - 1.24 mg/dL Final  . Calcium 54/06/8118 9.5  8.9 - 10.3 mg/dL Final  . Total Protein 10/09/2018 7.2  6.5 - 8.1 g/dL Final  . Albumin 14/78/2956 4.8  3.5 - 5.0 g/dL Final  . AST 21/30/8657 22  15 - 41 U/L Final  . ALT 10/09/2018 28  0 - 44 U/L Final  . Alkaline Phosphatase 10/09/2018 52  38 - 126 U/L Final  . Total Bilirubin 10/09/2018 1.8* 0.3 - 1.2 mg/dL Final  . GFR calc non Af Amer 10/09/2018 >60  >60 mL/min Final  . GFR calc Af Amer 10/09/2018 >60  >60 mL/min Final  . Anion gap  10/09/2018 9  5 - 15 Final   Performed at [facility], [address] [place], [place] [address]  . Prothrombin Time 10/09/2018 13.2  11.4 - 15.2 seconds Final  . INR 10/09/2018 1.01   Final   Performed at [facility], [address], [place], [place] [address]  . ABO/RH(D) 10/09/2018 O POS   Final  . Antibody Screen 10/09/2018 NEG   Final  . Sample Expiration 10/09/2018 10/15/2018   Final  . Extend sample reason 10/09/2018    Final                   Value:NO TRANSFUSIONS OR PREGNANCY IN THE PAST 3 MONTHS Performed at [facility], [address], [place], [place] [address]   . MRSA, PCR 10/09/2018 NEGATIVE  NEGATIVE Final  . Staphylococcus aureus 10/09/2018 POSITIVE* NEGATIVE Final   Comment: (NOTE) The Xpert SA Assay (FDA approved for NASAL specimens in patients 71 years of age and older), is one component of a comprehensive surveillance program. It is not intended to diagnose infection nor to guide or monitor treatment. Performed at [facility], [address] [place], [place] [address]   . ABO/RH(D) 10/09/2018    Final                   Value:O POS Performed at [facility], [address] [place], [place] [address]      X-Rays:Dg Pelvis Portable  Result Date: 10/12/2018 CLINICAL DATA:  47 year old male post right hip replacement. Subsequent encounter. EXAM: PORTABLE PELVIS 1-2 VIEWS COMPARISON:  Intraoperative exam 10/12/2018. Preoperative MR 06/10/2018. FINDINGS: Post total right hip replacement which appears in satisfactory position without complication on frontal projection. Drain in place. Left femoral head avascular necrosis. Mild degenerative changes lower lumbar spine incompletely assessed. IMPRESSION: 1. Post right total hip replacement. 2. Left femoral head avascular necrosis. Electronically Signed   By: Lacy Duverney M.D.   On: 10/12/2018 17:52   Dg C-arm 1-60 Min-no  Report  Result Date: 10/12/2018 Fluoroscopy was utilized by the requesting physician.  No radiographic interpretation.   Dg Hip Operative Unilat W Or W/o Pelvis Right  Result Date: 10/12/2018 CLINICAL DATA:  47 year old male for right hip replacement. Subsequent encounter. EXAM: OPERATIVE right HIP (WITH PELVIS IF PERFORMED) 2 VIEWS TECHNIQUE: Fluoroscopic spot image(s) were submitted for interpretation post-operatively. Fluoroscopic time:  4 seconds. COMPARISON:  05/25/2018 plain film exam.  06/10/2018 MR. FINDINGS: Post total right hip replacement which appears in satisfactory position without complication noted on frontal projection. Surgical drain is in place. Left femoral head avascular necrosis. IMPRESSION: 1. Post right hip replacement. 2. Left femoral head avascular necrosis. Electronically Signed   By: Lacy Duverney M.D.   On: 10/12/2018 17:55    EKG:No orders found for this or any previous visit.   Hospital Course: Samuel Hardy is a 47 y.o. who was admitted to [facility]. They were brought to the operating room on 10/12/2018 and underwent Procedure(s): TOTAL HIP ARTHROPLASTY ANTERIOR APPROACH.  Patient tolerated the procedure well and was later transferred to the recovery room and then to the orthopaedic floor for postoperative care. They were given PO and IV analgesics for pain control following their surgery. They were given 24 hours of postoperative antibiotics of  Anti-infectives (From admission, onward)   Start     Dose/Rate Route Frequency Ordered Stop   10/13/18 0600  ceFAZolin (ANCEF) IVPB 2g/100 mL premix     2 g 200 mL/hr over 30 Minutes Intravenous On call to O.R. 10/12/18 1206 10/13/18 0855   10/12/18 2130  ceFAZolin (ANCEF) IVPB 2g/100 mL premix     2 g 200 mL/hr over 30 Minutes Intravenous Every 6 hours 10/12/18 1929 10/13/18 0653     and started on DVT prophylaxis in the form of Xarelto.   PT and OT were ordered for total joint protocol. Discharge planning  consulted to help with postop disposition and equipment needs.  Patient had a good night on the evening of surgery. They started to get up OOB with therapy on POD #1. Pt was seen during rounds and was ready to go home pending progress with therapy. Hemovac drain was pulled without difficulty. He worked with therapy on POD #1 and was meeting his goals. Pt was discharged to home later that day in stable condition.  Diet: Regular diet Activity: WBAT Follow-up: in 2 weeks with Dr. Lequita Halt Disposition: Home with HEP Discharged Condition: stable   Discharge Instructions    Call MD / Call 911   Complete by:  As directed    If you experience chest pain or shortness of breath, CALL 911 and be transported to the hospital emergency room.  If you develope a fever above 101 F, pus (white drainage) or increased drainage or redness at the wound, or calf pain, call your surgeon's office.   Change dressing   Complete by:  As directed    You may change your dressing on Wednesday, then change the dressing daily with sterile 4 x 4 inch gauze dressing and paper tape.   Constipation Prevention   Complete by:  As directed    Drink plenty of fluids.  Prune juice may be helpful.  You may use a stool softener, such as Colace (over the counter) 100 mg twice a day.  Use MiraLax (over the counter) for constipation as needed.   Diet - low sodium heart healthy   Complete by:  As directed    Discharge instructions   Complete by:  As directed    Dr. Ollen Gross Total Joint Specialist [facility] [address] [place], [place] [address] [phone]  ANTERIOR APPROACH TOTAL HIP REPLACEMENT POSTOPERATIVE DIRECTIONS   Hip Rehabilitation, Guidelines Following Surgery  The results of a hip operation are greatly improved after range of motion and muscle strengthening exercises. Follow all safety measures which  are given to protect your hip. If any of these exercises cause increased pain or swelling in  your joint, decrease the amount until you are comfortable again. Then slowly increase the exercises. Call your caregiver if you have problems or questions.   HOME CARE INSTRUCTIONS  Remove items at home which could result in a fall. This includes throw rugs or furniture in walking pathways.  ICE to the affected hip every three hours for 30 minutes at a time and then as needed for pain and swelling.  Continue to use ice on the hip for pain and swelling from surgery. You may notice swelling that will progress down to the foot and ankle.  This is normal after surgery.  Elevate the leg when you are not up walking on it.   Continue to use the breathing machine which will help keep your temperature down.  It is common for your temperature to cycle up and down following surgery, especially at night when you are not up moving around and exerting yourself.  The breathing machine keeps your lungs expanded and your temperature down.  DIET You may resume your previous home diet once your are discharged from the hospital.  DRESSING / WOUND CARE / SHOWERING You may shower 3 days after surgery, but keep the wounds dry during showering.  You may use an occlusive plastic wrap (Press'n Seal for example), NO SOAKING/SUBMERGING IN THE BATHTUB.  If the bandage gets wet, change with a clean dry gauze.  If the incision gets wet, pat the wound dry with a clean towel. You may start showering once you are discharged home but do not submerge the incision under water. Just pat the incision dry and apply a dry gauze dressing on daily. Change the surgical dressing daily and reapply a dry dressing each time.  ACTIVITY Walk with your walker as instructed. Use walker as long as suggested by your caregivers. Avoid periods of inactivity such as sitting longer than an hour when not asleep. This helps prevent blood clots.  You may resume a sexual relationship in one month or when given the OK by your doctor.  You may return to work  once you are cleared by your doctor.  Do not drive a car for 6 weeks or until released by you surgeon.  Do not drive while taking narcotics.  WEIGHT BEARING Weight bearing as tolerated with assist device (walker, cane, etc) as directed, use it as long as suggested by your surgeon or therapist, typically at least 4-6 weeks.  POSTOPERATIVE CONSTIPATION PROTOCOL Constipation - defined medically as fewer than three stools per week and severe constipation as less than one stool per week.  One of the most common issues patients have following surgery is constipation.  Even if you have a regular bowel pattern at home, your normal regimen is likely to be disrupted due to multiple reasons following surgery.  Combination of anesthesia, postoperative narcotics, change in appetite and fluid intake all can affect your bowels.  In order to avoid complications following surgery, here are some recommendations in order to help you during your recovery period.  Colace (docusate) - Pick up an over-the-counter form of Colace or another stool softener and take twice a day as long as you are requiring postoperative pain medications.  Take with a full glass of water daily.  If you experience loose stools or diarrhea, hold the colace until you stool forms back up.  If your symptoms do not get better within 1 week or  if they get worse, check with your doctor.  Dulcolax (bisacodyl) - Pick up over-the-counter and take as directed by the product packaging as needed to assist with the movement of your bowels.  Take with a full glass of water.  Use this product as needed if not relieved by Colace only.   MiraLax (polyethylene glycol) - Pick up over-the-counter to have on hand.  MiraLax is a solution that will increase the amount of water in your bowels to assist with bowel movements.  Take as directed and can mix with a glass of water, juice, soda, coffee, or tea.  Take if you go more than two days without a movement. Do not use  MiraLax more than once per day. Call your doctor if you are still constipated or irregular after using this medication for 7 days in a row.  If you continue to have problems with postoperative constipation, please contact the office for further assistance and recommendations.  If you experience "the worst abdominal pain ever" or develop nausea or vomiting, please contact the office immediatly for further recommendations for treatment.  ITCHING  If you experience itching with your medications, try taking only a single pain pill, or even half a pain pill at a time.  You can also use Benadryl over the counter for itching or also to help with sleep.   TED HOSE STOCKINGS Wear the elastic stockings on both legs for three weeks following surgery during the day but you may remove then at night for sleeping.  MEDICATIONS See your medication summary on the "After Visit Summary" that the nursing staff will review with you prior to discharge.  You may have some home medications which will be placed on hold until you complete the course of blood thinner medication.  It is important for you to complete the blood thinner medication as prescribed by your surgeon.  Continue your approved medications as instructed at time of discharge.  PRECAUTIONS If you experience chest pain or shortness of breath - call 911 immediately for transfer to the hospital emergency department.  If you develop a fever greater that 101 F, purulent drainage from wound, increased redness or drainage from wound, foul odor from the wound/dressing, or calf pain - CONTACT YOUR SURGEON.                                                   FOLLOW-UP APPOINTMENTS Make sure you keep all of your appointments after your operation with your surgeon and caregivers. You should call the office at the above phone number and make an appointment for approximately two weeks after the date of your surgery or on the date instructed by your surgeon outlined in the  "After Visit Summary".  RANGE OF MOTION AND STRENGTHENING EXERCISES  These exercises are designed to help you keep full movement of your hip joint. Follow your caregiver's or physical therapist's instructions. Perform all exercises about fifteen times, three times per day or as directed. Exercise both hips, even if you have had only one joint replacement. These exercises can be done on a training (exercise) mat, on the floor, on a table or on a bed. Use whatever works the best and is most comfortable for you. Use music or television while you are exercising so that the exercises are a pleasant break in your day. This will make  your life better with the exercises acting as a break in routine you can look forward to.  Lying on your back, slowly slide your foot toward your buttocks, raising your knee up off the floor. Then slowly slide your foot back down until your leg is straight again.  Lying on your back spread your legs as far apart as you can without causing discomfort.  Lying on your side, raise your upper leg and foot straight up from the floor as far as is comfortable. Slowly lower the leg and repeat.  Lying on your back, tighten up the muscle in the front of your thigh (quadriceps muscles). You can do this by keeping your leg straight and trying to raise your heel off the floor. This helps strengthen the largest muscle supporting your knee.  Lying on your back, tighten up the muscles of your buttocks both with the legs straight and with the knee bent at a comfortable angle while keeping your heel on the floor.   IF YOU ARE TRANSFERRED TO A SKILLED REHAB FACILITY If the patient is transferred to a skilled rehab facility following release from the hospital, a list of the current medications will be sent to the facility for the patient to continue.  When discharged from the skilled rehab facility, please have the facility set up the patient's Home Health Physical Therapy prior to being released. Also,  the skilled facility will be responsible for providing the patient with their medications at time of release from the facility to include their pain medication, the muscle relaxants, and their blood thinner medication. If the patient is still at the rehab facility at time of the two week follow up appointment, the skilled rehab facility will also need to assist the patient in arranging follow up appointment in our office and any transportation needs.  MAKE SURE YOU:  Understand these instructions.  Get help right away if you are not doing well or get worse.    Pick up stool softner and laxative for home use following surgery while on pain medications. Do not submerge incision under water. Please use good hand washing techniques while changing dressing each day. May shower starting three days after surgery. Please use a clean towel to pat the incision dry following showers. Continue to use ice for pain and swelling after surgery. Do not use any lotions or creams on the incision until instructed by your surgeon.   Do not sit on low chairs, stoools or toilet seats, as it may be difficult to get up from low surfaces   Complete by:  As directed    Driving restrictions   Complete by:  As directed    No driving for two weeks   TED hose   Complete by:  As directed    Use stockings (TED hose) for three weeks on both leg(s).  You may remove them at night for sleeping.   Weight bearing as tolerated   Complete by:  As directed      Allergies as of 10/13/2018      Reactions   Clindamycin/lincomycin Diarrhea, Nausea And Vomiting   Pt had severe and prolonged vomiting and diarhea last time he had clindamycin   Aspirin Nausea Only      Medication List    TAKE these medications   HYDROcodone-acetaminophen 5-325 MG tablet Commonly known as:  NORCO/VICODIN Take 1-2 tablets by mouth every 6 (six) hours as needed for moderate pain (pain score 4-6).   methocarbamol 500 MG tablet Commonly  known as:   ROBAXIN Take 1 tablet (500 mg total) by mouth every 6 (six) hours as needed for muscle spasms.   NON FORMULARY Apply 1 application topically daily as needed (back pain). Compounded pain cream from [facility] for his back  To use as needed     Once a day usually   rivaroxaban 10 MG Tabs tablet Commonly known as:  XARELTO Take 1 tablet (10 mg total) by mouth daily with breakfast for 20 days.   SUMAtriptan 20 MG/ACT nasal spray Commonly known as:  IMITREX Place 20 mg into the nose every 2 (two) hours as needed for migraine or headache (Pt reports that he hasn't had to take for a while). May repeat in 2 hours if headache persists or recurs.   traMADol 50 MG tablet Commonly known as:  ULTRAM Take 1-2 tablets (50-100 mg total) by mouth every 6 (six) hours as needed for moderate pain.   verapamil 80 MG tablet Commonly known as:  CALAN Take 80 mg by mouth daily as needed. Takes as needed for cluster headaches            Discharge Care Instructions  (From admission, onward)         Start     Ordered   10/13/18 0000  Weight bearing as tolerated     10/13/18 0736   10/13/18 0000  Change dressing    Comments:  You may change your dressing on Wednesday, then change the dressing daily with sterile 4 x 4 inch gauze dressing and paper tape.   10/13/18 0736         Follow-up Information    Ollen GrossAluisio, Frank, MD. Schedule an appointment as soon as possible for a visit on 10/27/2018.   Specialty:  Orthopedic Surgery Contact information: [address] [place] [id number] [phone]           Signed: Arther AbbottKristie Mellany Dinsmore, PA-C Orthopedic Surgery 10/19/2018, 7:29 AM

## 2018-10-22 DIAGNOSIS — G4733 Obstructive sleep apnea (adult) (pediatric): Secondary | ICD-10-CM | POA: Diagnosis not present

## 2018-10-26 MED FILL — METHOCARBAMOL 500 MG TABS: 500 | 8 days supply | Qty: 30 | Fill #0

## 2018-10-26 MED FILL — traMADol HCL 50 MG TABS: 50 | 7 days supply | Qty: 28 | Fill #0

## 2018-11-17 DIAGNOSIS — Z96641 Presence of right artificial hip joint: Secondary | ICD-10-CM | POA: Diagnosis not present

## 2018-11-17 DIAGNOSIS — Z471 Aftercare following joint replacement surgery: Secondary | ICD-10-CM | POA: Diagnosis not present

## 2018-12-10 DIAGNOSIS — M5417 Radiculopathy, lumbosacral region: Secondary | ICD-10-CM | POA: Diagnosis not present

## 2019-03-04 DIAGNOSIS — G44009 Cluster headache syndrome, unspecified, not intractable: Secondary | ICD-10-CM | POA: Diagnosis not present

## 2019-03-04 DIAGNOSIS — Z20828 Contact with and (suspected) exposure to other viral communicable diseases: Secondary | ICD-10-CM | POA: Diagnosis not present

## 2019-03-04 DIAGNOSIS — R05 Cough: Secondary | ICD-10-CM | POA: Diagnosis not present

## 2019-03-04 DIAGNOSIS — Z96641 Presence of right artificial hip joint: Secondary | ICD-10-CM | POA: Diagnosis not present

## 2019-03-04 DIAGNOSIS — R0602 Shortness of breath: Secondary | ICD-10-CM | POA: Diagnosis not present

## 2019-03-30 DIAGNOSIS — M5417 Radiculopathy, lumbosacral region: Secondary | ICD-10-CM | POA: Diagnosis not present

## 2019-03-30 DIAGNOSIS — M5126 Other intervertebral disc displacement, lumbar region: Secondary | ICD-10-CM | POA: Diagnosis not present

## 2019-03-30 DIAGNOSIS — M545 Low back pain: Secondary | ICD-10-CM | POA: Diagnosis not present

## 2019-04-12 DIAGNOSIS — G44001 Cluster headache syndrome, unspecified, intractable: Secondary | ICD-10-CM | POA: Diagnosis not present

## 2019-05-05 ENCOUNTER — Ambulatory Visit (HOSPITAL_COMMUNITY)
Admission: RE | Admit: 2019-05-05 | Discharge: 2019-05-05 | Disposition: A | Discharge status: Home / Self Care | Payer: 59 | Source: Ambulatory Visit | Attending: Family Medicine | Admitting: Family Medicine

## 2019-05-05 ENCOUNTER — Other Ambulatory Visit (HOSPITAL_COMMUNITY): Payer: Self-pay | Admitting: Family Medicine

## 2019-05-05 ENCOUNTER — Other Ambulatory Visit: Payer: Self-pay

## 2019-05-05 DIAGNOSIS — S299XXA Unspecified injury of thorax, initial encounter: Secondary | ICD-10-CM | POA: Diagnosis not present

## 2019-05-05 DIAGNOSIS — L03818 Cellulitis of other sites: Secondary | ICD-10-CM | POA: Diagnosis not present

## 2019-05-05 DIAGNOSIS — M79601 Pain in right arm: Secondary | ICD-10-CM | POA: Diagnosis not present

## 2019-05-05 DIAGNOSIS — R0781 Pleurodynia: Secondary | ICD-10-CM

## 2019-05-05 DIAGNOSIS — R0782 Intercostal pain: Secondary | ICD-10-CM | POA: Diagnosis not present

## 2019-05-05 DIAGNOSIS — R079 Chest pain, unspecified: Secondary | ICD-10-CM | POA: Diagnosis not present

## 2019-05-05 DIAGNOSIS — G4489 Other headache syndrome: Secondary | ICD-10-CM | POA: Diagnosis not present

## 2019-05-05 DIAGNOSIS — M79602 Pain in left arm: Secondary | ICD-10-CM | POA: Diagnosis not present

## 2019-05-06 ENCOUNTER — Other Ambulatory Visit (HOSPITAL_COMMUNITY): Payer: Self-pay | Admitting: Family Medicine

## 2019-05-06 ENCOUNTER — Ambulatory Visit (HOSPITAL_COMMUNITY)
Admission: RE | Admit: 2019-05-06 | Discharge: 2019-05-06 | Disposition: A | Discharge status: Home / Self Care | Payer: 59 | Source: Ambulatory Visit | Attending: Family Medicine | Admitting: Family Medicine

## 2019-05-06 ENCOUNTER — Other Ambulatory Visit: Payer: Self-pay | Admitting: Family Medicine

## 2019-05-06 DIAGNOSIS — M25551 Pain in right hip: Secondary | ICD-10-CM

## 2019-05-06 DIAGNOSIS — Z96641 Presence of right artificial hip joint: Secondary | ICD-10-CM | POA: Diagnosis not present

## 2019-05-21 MED FILL — VERAPAMIL HCL 40 MG TABS: 40 | 15 days supply | Qty: 90 | Fill #0

## 2019-05-21 MED FILL — SUMAtriptan 20 MG/ACT SOLN: 20 | 15 days supply | Qty: 6 | Fill #0

## 2019-05-21 MED FILL — SUMATRIPTAN 4 MG/0.5 ML CAR: 4 | 28 days supply | Qty: 4 | Fill #0

## 2019-05-27 DIAGNOSIS — H5213 Myopia, bilateral: Secondary | ICD-10-CM | POA: Diagnosis not present

## 2019-06-12 DIAGNOSIS — M545 Low back pain: Secondary | ICD-10-CM | POA: Diagnosis not present

## 2019-06-12 DIAGNOSIS — L309 Dermatitis, unspecified: Secondary | ICD-10-CM | POA: Diagnosis not present

## 2019-06-22 MED FILL — SUMAtriptan 20 MG/ACT SOLN: 20 | 15 days supply | Qty: 6 | Fill #1

## 2019-06-22 MED FILL — SUMATRIPTAN 4 MG/0.5 ML CAR: 4 | 28 days supply | Qty: 4 | Fill #1

## 2019-07-06 MED FILL — VERAPAMIL HCL 40 MG TABS: 40 | 15 days supply | Qty: 90 | Fill #1

## 2019-07-07 DIAGNOSIS — R05 Cough: Secondary | ICD-10-CM | POA: Diagnosis not present

## 2019-07-07 DIAGNOSIS — G44009 Cluster headache syndrome, unspecified, not intractable: Secondary | ICD-10-CM | POA: Diagnosis not present

## 2021-06-05 IMAGING — DX CHEST - 2 VIEW
2 series · 2 of 2 positions shown · non-contrast
Comparison: None.

CLINICAL DATA: Pain after fall

EXAM:
CHEST - 2 VIEW

[chest pa]
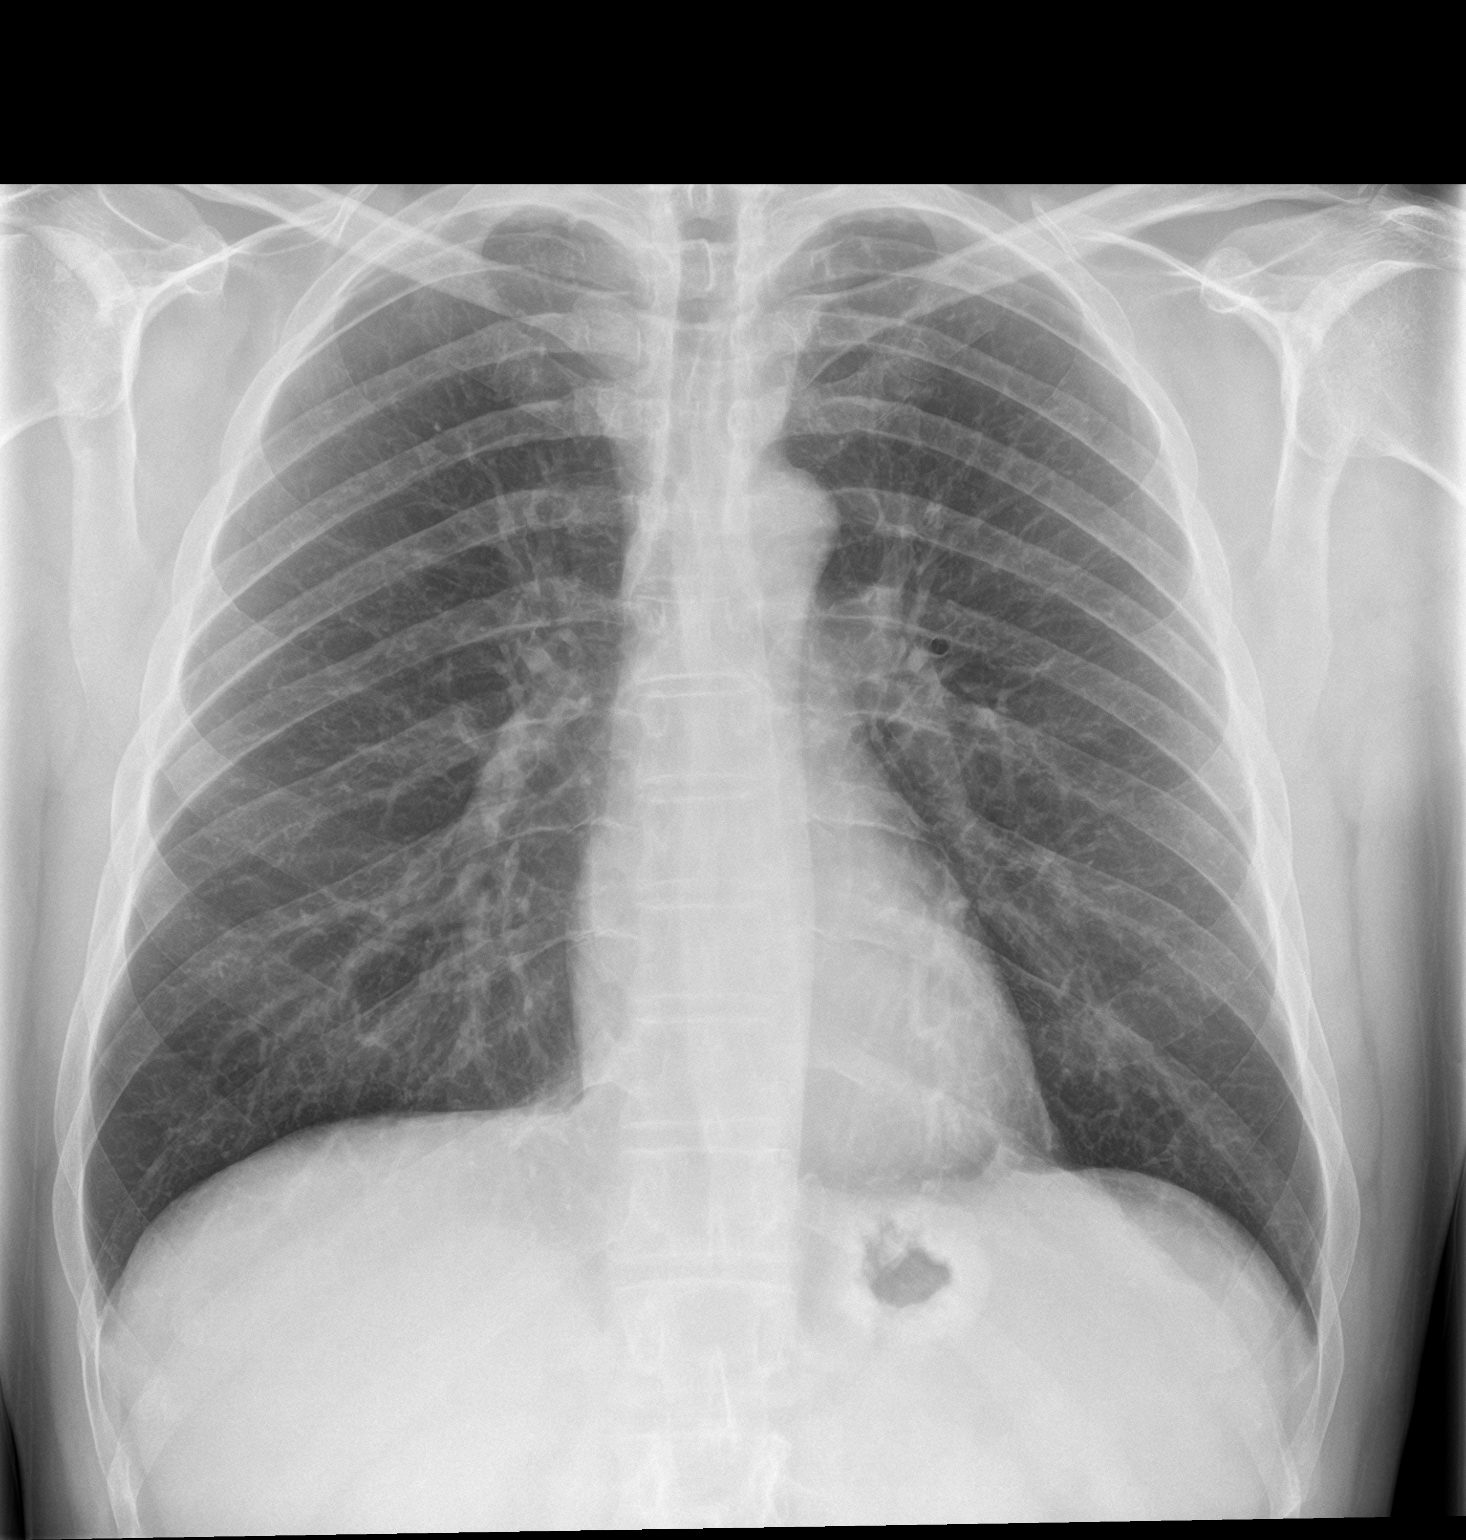

[chest lat]
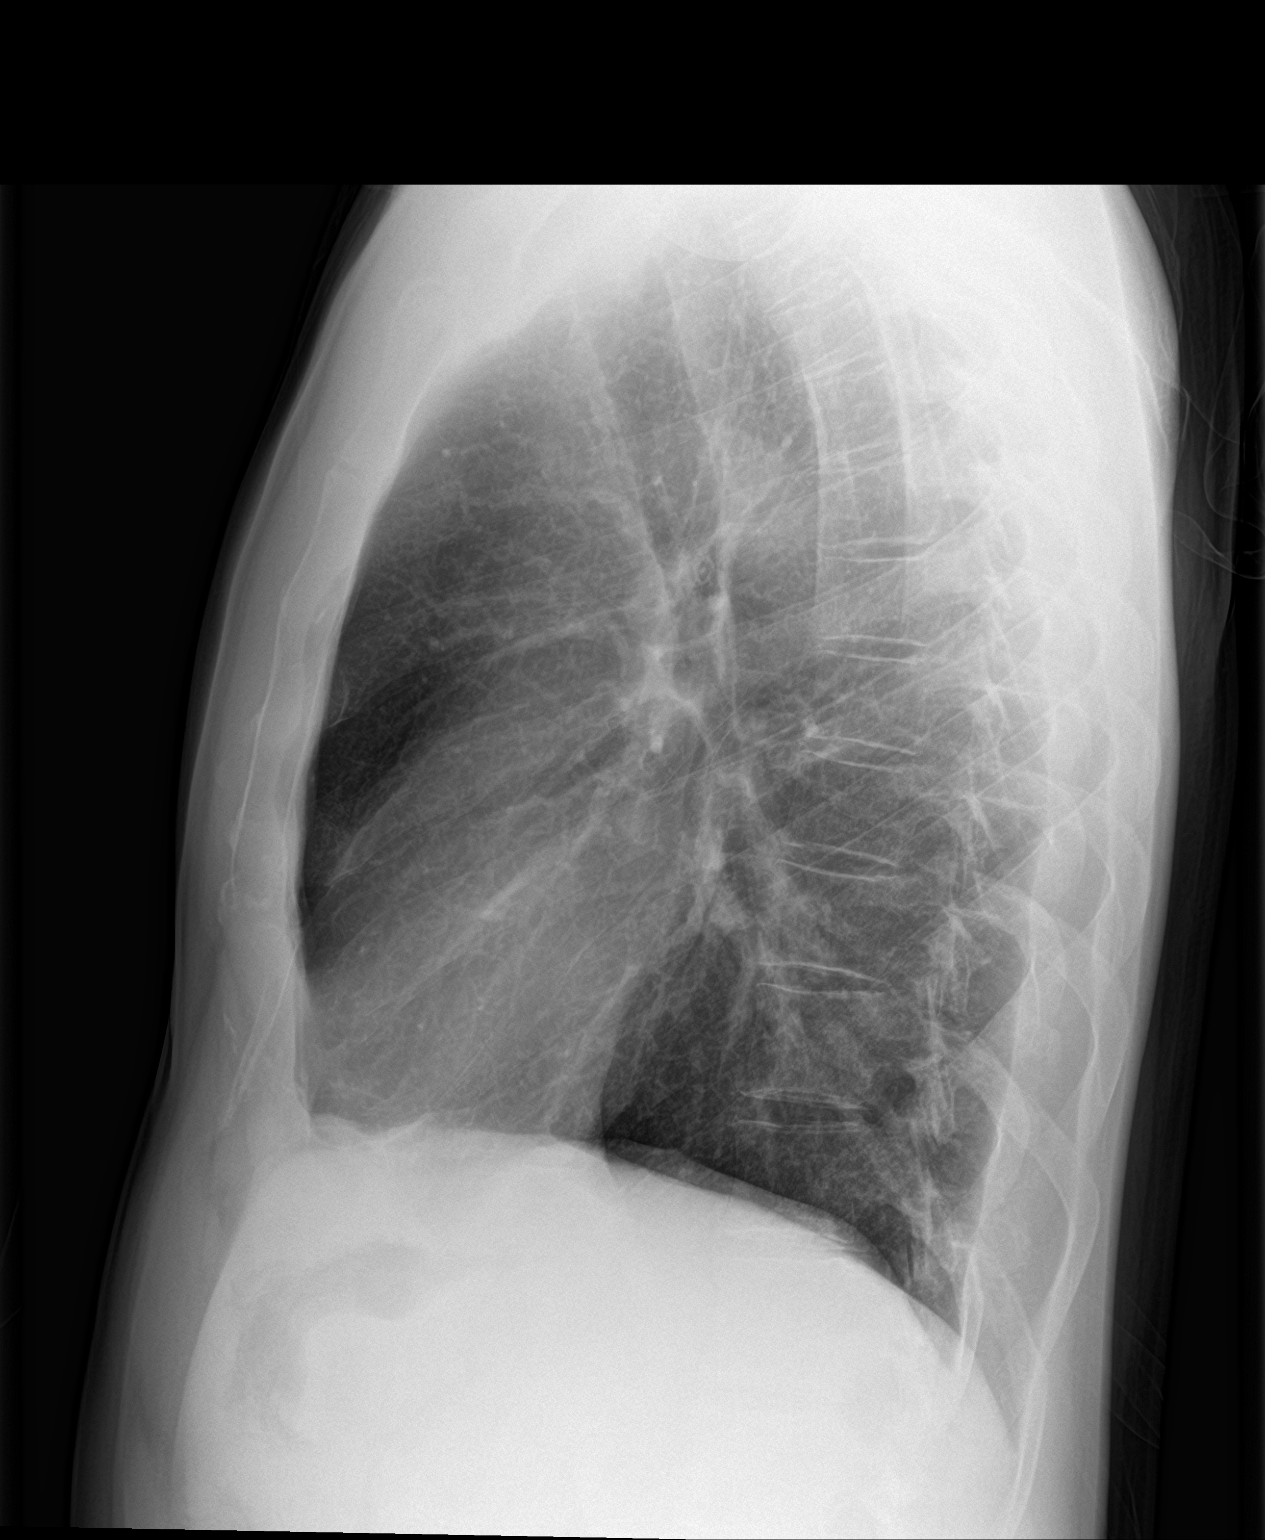

[2 of 2 positions shown; findings below may reference images not displayed]

FINDINGS: The heart size and mediastinal contours are within normal limits.
Both lungs are clear. The visualized skeletal structures are
unremarkable.
IMPRESSION: No active cardiopulmonary disease.

## 2021-06-06 IMAGING — DX DG HIP (WITH OR WITHOUT PELVIS) 2-3V RIGHT
3 series · 3 of 3 positions shown · non-contrast
Comparison: Postoperative radiograph 10/12/2018

CLINICAL DATA: Right hip pain. Fall this weekend. Prior right hip
arthroplasty.

EXAM:
DG HIP (WITH OR WITHOUT PELVIS) 2-3V RIGHT

[pelvis ap]
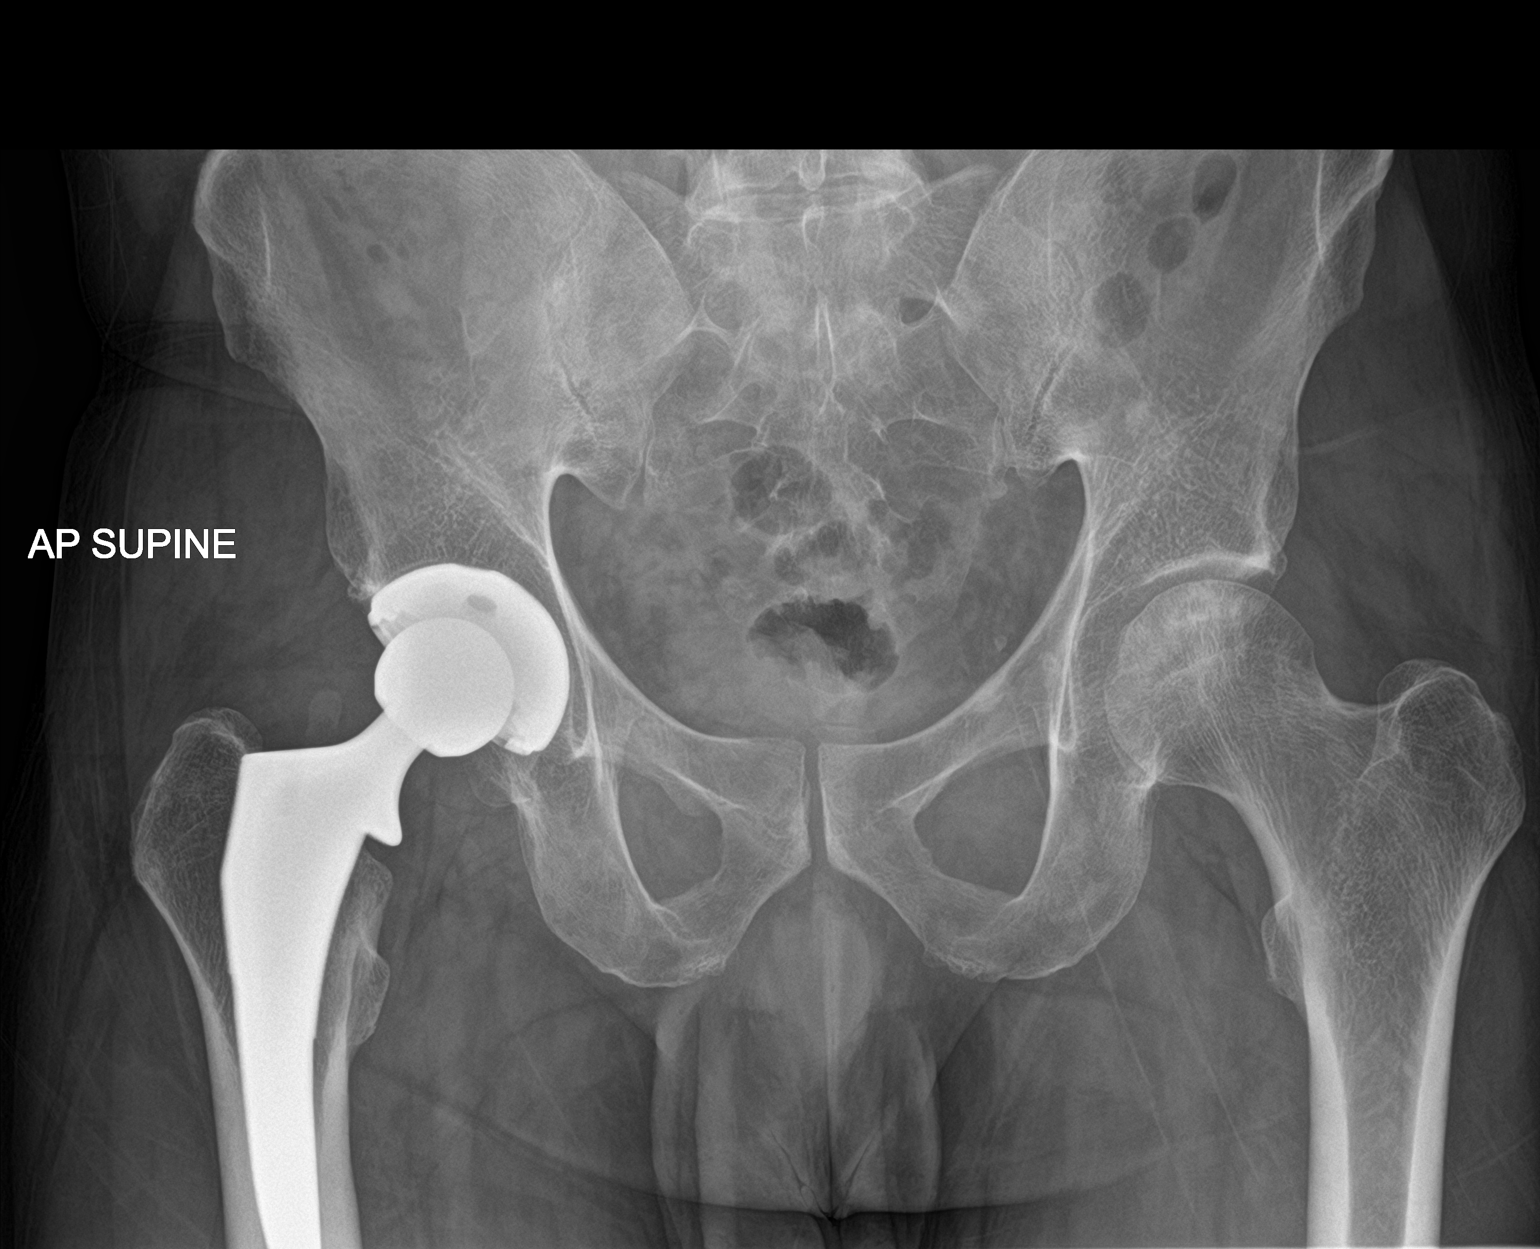

[hip ap]
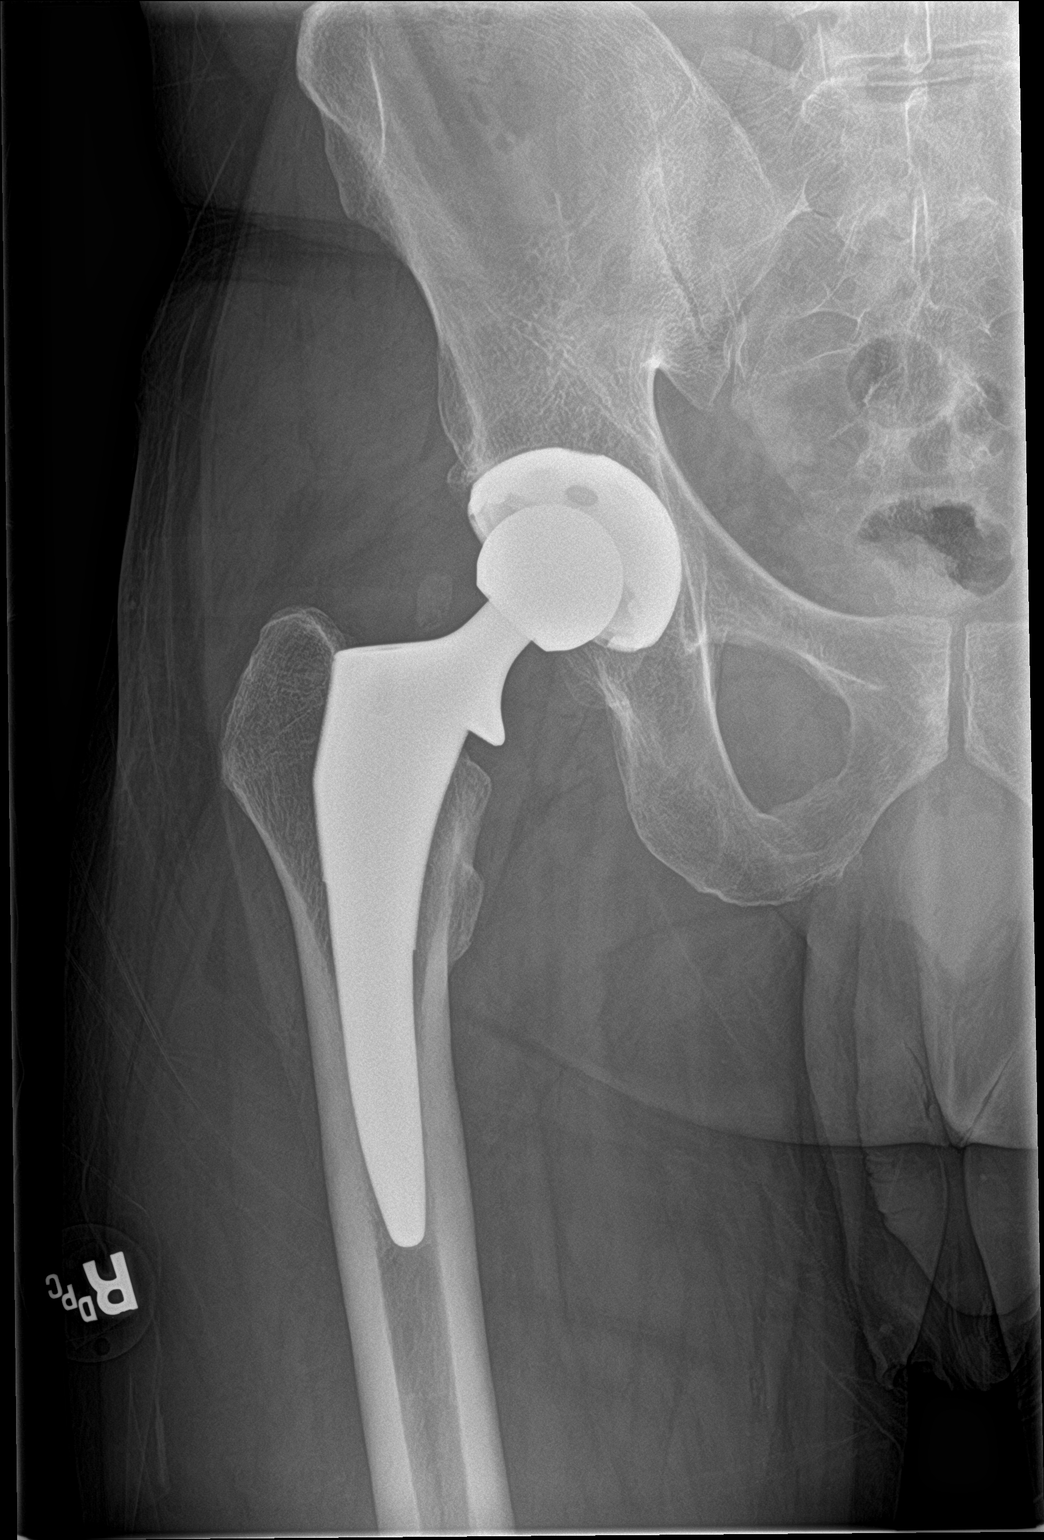

[hip lat]
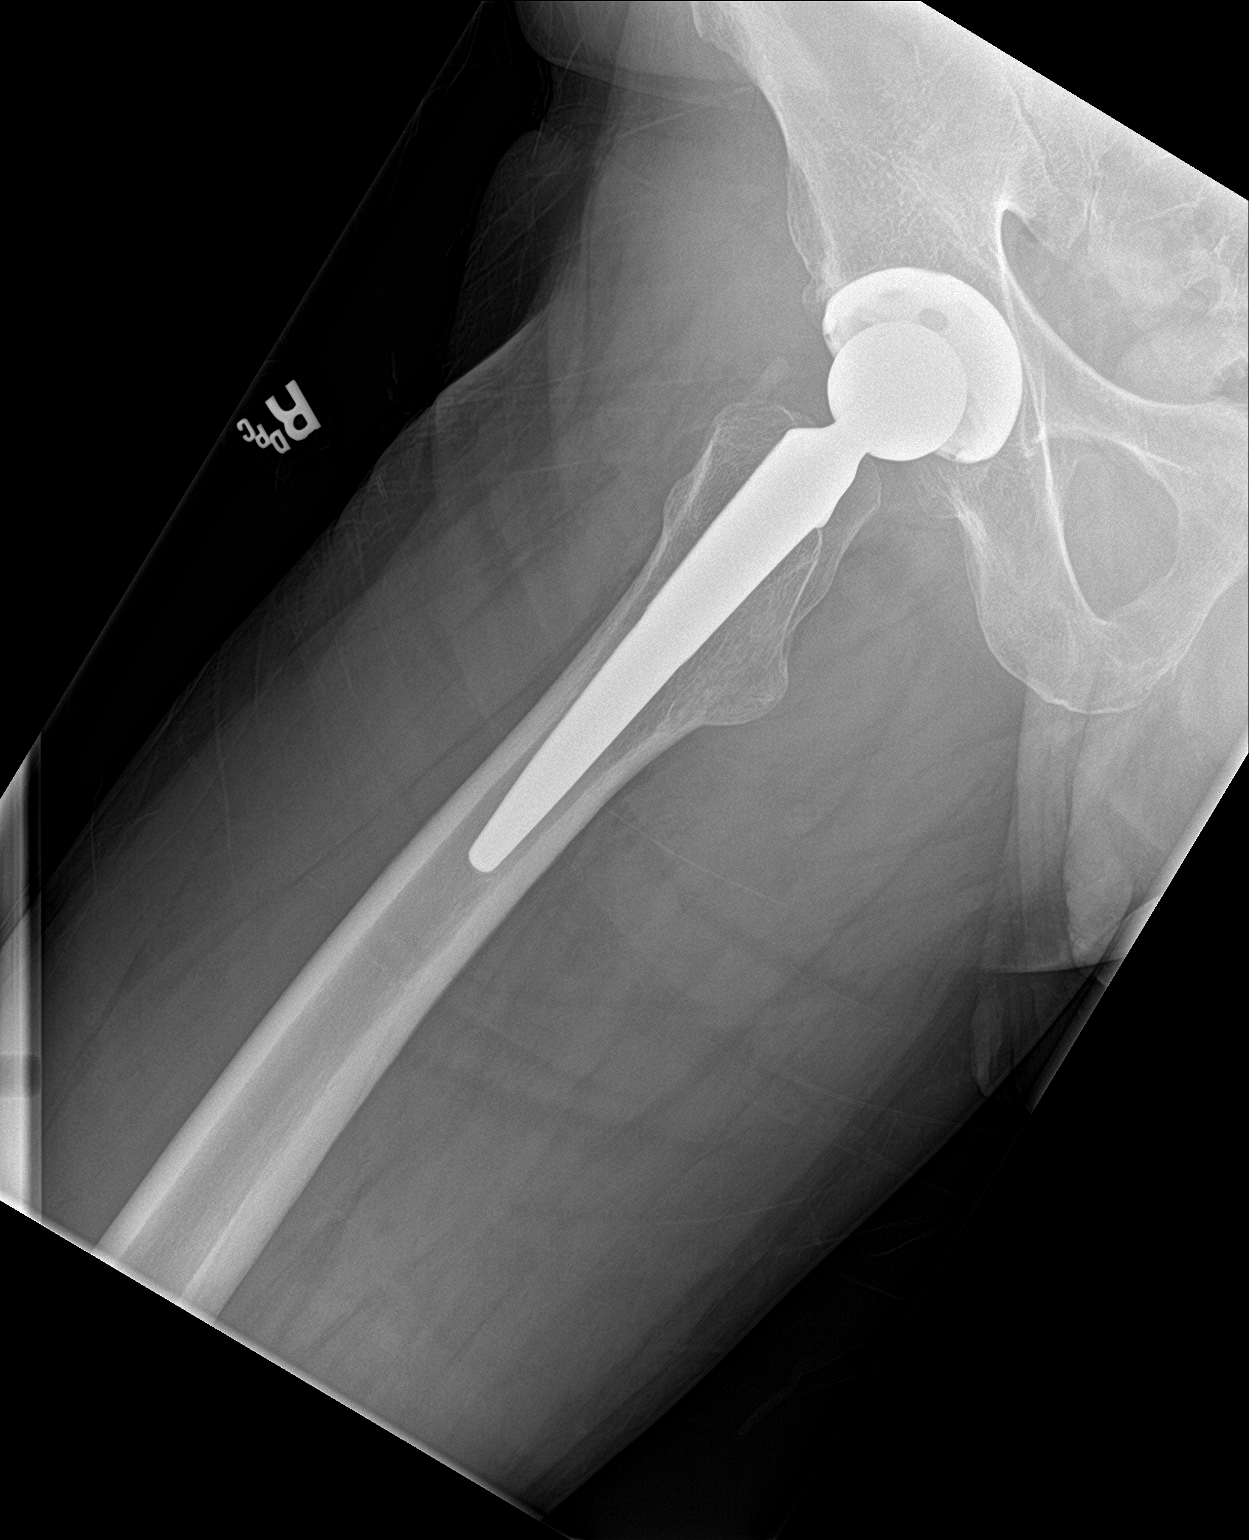

[3 of 3 positions shown; findings below may reference images not displayed]

FINDINGS: Right hip arthroplasty in expected alignment. No periprosthetic
lucency or fracture. Femoral stem is midline. Pubic rami are intact.
Pubic symphysis and sacroiliac joints are congruent. Chronic
avascular necrosis of the left femoral head as before.
IMPRESSION: 1. Right hip arthroplasty without complication or acute fracture.
2. Chronic avascular necrosis of left femoral head.
